# Patient Record
Sex: Female | Born: 1945 | Race: Black or African American | Hispanic: No | Marital: Married | Smoking: Current every day smoker
Health system: Southern US, Community
[De-identification: ages and names within clinical notes are randomized; demographics above are authoritative.]

## PROBLEM LIST (undated history)

## (undated) DIAGNOSIS — K219 Gastro-esophageal reflux disease without esophagitis: Secondary | ICD-10-CM

## (undated) DIAGNOSIS — K5909 Other constipation: Secondary | ICD-10-CM

## (undated) DIAGNOSIS — R29898 Other symptoms and signs involving the musculoskeletal system: Secondary | ICD-10-CM

## (undated) DIAGNOSIS — I10 Essential (primary) hypertension: Secondary | ICD-10-CM

## (undated) DIAGNOSIS — I639 Cerebral infarction, unspecified: Secondary | ICD-10-CM

## (undated) HISTORY — DX: Essential (primary) hypertension: I10

## (undated) HISTORY — PX: CYSTECTOMY: SUR359

## (undated) HISTORY — DX: Other constipation: K59.09

## (undated) HISTORY — DX: Other symptoms and signs involving the musculoskeletal system: R29.898

## (undated) HISTORY — DX: Gastro-esophageal reflux disease without esophagitis: K21.9

## (undated) HISTORY — DX: Cerebral infarction, unspecified: I63.9

## (undated) HISTORY — PX: TOTAL ABDOMINAL HYSTERECTOMY: SHX209

---

## 2001-06-22 ENCOUNTER — Encounter: Payer: Self-pay | Admitting: Family Medicine

## 2001-06-22 ENCOUNTER — Ambulatory Visit (HOSPITAL_COMMUNITY): Admission: RE | Admit: 2001-06-22 | Discharge: 2001-06-22 | Payer: Self-pay | Admitting: Family Medicine

## 2003-10-13 ENCOUNTER — Emergency Department (HOSPITAL_COMMUNITY): Admission: EM | Admit: 2003-10-13 | Discharge: 2003-10-13 | Payer: Self-pay | Admitting: Emergency Medicine

## 2006-05-02 ENCOUNTER — Emergency Department (HOSPITAL_COMMUNITY): Admission: EM | Admit: 2006-05-02 | Discharge: 2006-05-02 | Payer: Self-pay | Admitting: Emergency Medicine

## 2007-04-23 ENCOUNTER — Emergency Department (HOSPITAL_COMMUNITY): Admission: EM | Admit: 2007-04-23 | Discharge: 2007-04-24 | Payer: Self-pay | Admitting: Emergency Medicine

## 2009-11-20 ENCOUNTER — Ambulatory Visit (HOSPITAL_COMMUNITY): Admission: RE | Admit: 2009-11-20 | Discharge: 2009-11-20 | Payer: Self-pay | Admitting: Family Medicine

## 2010-09-25 DIAGNOSIS — I639 Cerebral infarction, unspecified: Secondary | ICD-10-CM

## 2010-09-25 HISTORY — DX: Cerebral infarction, unspecified: I63.9

## 2010-09-30 ENCOUNTER — Emergency Department (HOSPITAL_COMMUNITY): Payer: Medicaid Other

## 2010-09-30 ENCOUNTER — Inpatient Hospital Stay (HOSPITAL_COMMUNITY)
Admission: EM | Admit: 2010-09-30 | Discharge: 2010-10-02 | DRG: 066 | Disposition: A | Discharge status: Home Health | Payer: Medicaid Other | Attending: Family Medicine | Admitting: Family Medicine

## 2010-09-30 DIAGNOSIS — I10 Essential (primary) hypertension: Secondary | ICD-10-CM | POA: Diagnosis present

## 2010-09-30 DIAGNOSIS — E876 Hypokalemia: Secondary | ICD-10-CM | POA: Diagnosis present

## 2010-09-30 DIAGNOSIS — I6350 Cerebral infarction due to unspecified occlusion or stenosis of unspecified cerebral artery: Principal | ICD-10-CM | POA: Diagnosis present

## 2010-09-30 DIAGNOSIS — R279 Unspecified lack of coordination: Secondary | ICD-10-CM | POA: Diagnosis present

## 2010-09-30 DIAGNOSIS — E669 Obesity, unspecified: Secondary | ICD-10-CM | POA: Diagnosis present

## 2010-09-30 DIAGNOSIS — Z87891 Personal history of nicotine dependence: Secondary | ICD-10-CM

## 2010-09-30 DIAGNOSIS — R7309 Other abnormal glucose: Secondary | ICD-10-CM | POA: Diagnosis present

## 2010-09-30 DIAGNOSIS — Z833 Family history of diabetes mellitus: Secondary | ICD-10-CM

## 2010-09-30 DIAGNOSIS — R4789 Other speech disturbances: Secondary | ICD-10-CM | POA: Diagnosis present

## 2010-09-30 LAB — URINALYSIS, ROUTINE W REFLEX MICROSCOPIC
Bilirubin Urine: NEGATIVE
Glucose, UA: NEGATIVE mg/dL
Ketones, ur: NEGATIVE mg/dL
Leukocytes, UA: NEGATIVE
Nitrite: NEGATIVE
Specific Gravity, Urine: 1.01 (ref 1.005–1.030)
pH: 5.5 (ref 5.0–8.0)

## 2010-09-30 LAB — ETHANOL: Alcohol, Ethyl (B): <5 mg/dL (ref 0–10)

## 2010-09-30 LAB — DIFFERENTIAL
Eosinophils Absolute: 0.1 10*3/uL (ref 0.0–0.7)
Lymphs Abs: 2.6 10*3/uL (ref 0.7–4.0)
Monocytes Relative: 6 % (ref 3–12)
Neutro Abs: 3.7 10*3/uL (ref 1.7–7.7)
Neutrophils Relative %: 54 % (ref 43–77)

## 2010-09-30 LAB — CBC
Hemoglobin: 13.6 g/dL (ref 12.0–15.0)
MCH: 30.2 pg (ref 26.0–34.0)
MCV: 90.7 fL (ref 78.0–100.0)
Platelets: 295 10*3/uL (ref 150–400)
RBC: 4.5 MIL/uL (ref 3.87–5.11)
WBC: 6.8 10*3/uL (ref 4.0–10.5)

## 2010-09-30 LAB — BASIC METABOLIC PANEL
Chloride: 98 mEq/L (ref 96–112)
GFR calc Af Amer: >60 mL/min (ref >60)
GFR calc non Af Amer: >60 mL/min (ref >60)
Potassium: 3.4 mEq/L — ABNORMAL LOW (ref 3.5–5.1)
Sodium: 138 mEq/L (ref 135–145)

## 2010-09-30 LAB — URINE MICROSCOPIC-ADD ON

## 2010-09-30 LAB — AMMONIA: Ammonia: 31 umol/L (ref 11–35)

## 2010-10-01 ENCOUNTER — Inpatient Hospital Stay (HOSPITAL_COMMUNITY): Payer: Medicaid Other

## 2010-10-01 DIAGNOSIS — I6789 Other cerebrovascular disease: Secondary | ICD-10-CM

## 2010-10-09 NOTE — Progress Notes (Signed)
  [name], [name]              ACCOUNT NO.:  [id number]  MEDICAL RECORD NO.:  [id number]           PATIENT TYPE:  I  LOCATION:  A311                          FACILITY:  [facility]  PHYSICIAN:  Mila Homer. Sudie Bailey, M.D.DATE OF BIRTH:  Oct 10, 1945  DATE OF PROCEDURE:  09/30/2010 DATE OF DISCHARGE:                                PROGRESS NOTE   SUBJECTIVE:  She feels somewhat better today.  She has gone through a workup for stroke today.  OBJECTIVE:  GENERAL:  She is sitting up in bed eating dinner.  She is in no acute distress.  She is well-developed, well-nourished. VITAL SIGNS:  Her temperature is 98.1, pulse 55, respiratory rate 20, blood pressure 127/69. NEURO:  Her speech still appears to be a little bit slurred. CARDIOVASCULAR:  Her heart has a regular rhythm without murmur. RESPIRATORY:  Lungs are clear throughout. ABDOMEN:  Soft.  O2 sats 97% room air.  The carotid Doppler showed minimal plaque in the right ICA and no plaque in the left ICA, and the MRA of the brain was normal.  ASSESSMENT:  Lacunar infarct.  PLAN:  We discussed this at length.  She will be on aspirin 81 mg daily and we will make sure she has good blood pressure and lipid control.  I reviewed the notes from Dr. Gerilyn Pilgrim, the neurologist.  Hopefully, we will be able to discharge her home tomorrow.  Discussed this with the patient and her husband.     Mila Homer. Sudie Bailey, M.D.     SDK/MEDQ  D:  10/01/2010  T:  10/01/2010  Job:  [id number]  Electronically Signed by John Giovanni M.D. on 10/09/2010 05:16:22 AM

## 2010-10-09 NOTE — H&P (Signed)
NAMEDENISHIA, CITRO              ACCOUNT NO.:  [id number]  MEDICAL RECORD NO.:  [id number]           PATIENT TYPE:  I  LOCATION:  A311                          FACILITY:  [facility]  PHYSICIAN:  Mila Homer. Sudie Bailey, M.D.DATE OF BIRTH:  1946-01-26  DATE OF ADMISSION:  09/30/2010 DATE OF DISCHARGE:  LH                             HISTORY & PHYSICAL   This 65 year old woman developed sudden onset of dizziness starting about 4:00 a.m. the morning of admission, which was September 30, 2010.  She also noted numbness and tingling in the left arm, although this is something she had had before in the prior month.  The symptoms persisted.  So when she got up finally at 7 or 8, she decided to come up to the office for evaluation.  Medical history includes a history of tobacco use disorder, obesity, lumbar spondylosis, and a family history of diabetes.  She was seen in the office about a week prior to admission because of left upperquadrant abdominal pain that she had been having intermittently for 2 years.  At that time her blood pressure was 150/100; however, her blood pressure in October 2010 was 120/86 and her blood pressure in April 2011 was 120/72.  She works at [employer].  She is on no current medications except for Cipro 500 mg b.i.d.  She had no problems swallowing.  I asked her about her speech and apparently she had had some slurred speech at home which her husband felt would clear on its own.  Exam in the office showed a 31ish-year-old woman who had problems walking.  When she walked in the hall, she stumbled and had to hold onto the side walls in order not to fall.  She was a ataxic.  Her BP was 154/90.  The pulse 80.  Her speech was somewhat slurred but her sentence structure was intact.  She appeared to be oriented and alert.  Her heart had a regular rhythm without murmur, rate of about 80.  Her lungs appeared to be clear throughout.  Her swallowing appeared to be  normal.  A presumptive diagnosis in the office was a cerebrovascular accident.  I immediately called the [facility] emergency room and arranged for her transfer there for further workup.  Her admission white cell count was 6800 with a normal diff.  Her hemoglobin was 13.6.  BMP showed a potassium 3.4, glucose 111.  Alcohol level was less than 5 and an ammonia level 31.  Admission UA stick was negative and scope showed 0-2 WBCs and 0-2 RBCs per HPF.  CT of the head without contrast showed no acute intracranial abnormality.  She had mild chronic microvascular ischemic changes in the white matter.  ADMISSION DIAGNOSES: 1. Presumptive stroke. 2. History of tobacco use. 3. Family history for diabetes. 4. Hyperglycemia. 5. Obesity.  She is admitted to the hospital with IV fluids and put on aspirin daily. I have asked Dr. Gerilyn Pilgrim, neurologist, to see her in consult.  Pending will be an MRI of the brain and carotid Dopplers as well as an MRA of the brain.  Mila Homer. Sudie Bailey, M.D.     SDK/MEDQ  D:  10/02/2010  T:  10/02/2010  Job:  [id number]  Electronically Signed by John Giovanni M.D. on 10/09/2010 05:16:10 AM

## 2010-10-09 NOTE — Discharge Summary (Signed)
  [name], [name]              ACCOUNT NO.:  [id number]  MEDICAL RECORD NO.:  [id number]           PATIENT TYPE:  I  LOCATION:  A311                          FACILITY:  [facility]  PHYSICIAN:  Mila Homer. Sudie Bailey, M.D.DATE OF BIRTH:  04-16-1946  DATE OF ADMISSION:  09/30/2010 DATE OF DISCHARGE:  LH                              DISCHARGE SUMMARY   HISTORY OF PRESENT ILLNESS:  This 65 year old was admitted to the hospital September 30, 2010 with what turned out to be a lacunar infarct. She had a benign 3-day hospitalization extending until October 02, 2010. Vital signs remained stable.  She had a normal admission CBC and UA.  Her alcohol level was less than 5.  Ammonia level was 31.  BMP showed a glucose minimally elevated 111, potassium 3.4.  CT of the head without contrast showed mild chronic microvascular ischemic changes in the white matter.  MRI of the brain showed a left paracentral pontine acute infarct with acute small vessel ischemia. Cerebral subcortical white matter changes.  MRA of the brain was normal and the carotid ultrasound was essentially normal as well, except for very minimal plaque in the proximal right ICA.  She was admitted to the hospital.  She was put on aspirin 81 mg daily and had a speech evaluation.  I consulted Dr. Gerilyn Pilgrim , neurologist.  She was doing well on the 3rd day able to swallow and her speech was improved with much less slurring.  She is still had issues with walking since she was ataxic when she came in but she had detailed instructions on using a walker or cane or help from family members.  The family is in the room with her and they do have a walker and canes and understand they need help with this until, hopefully, this clears.  I reviewed her blood pressures while she was in the hospital and her diastolic pressures were very good, systolic pressures ran between 127 and 152.  FINAL DISCHARGE DIAGNOSES: 1. Lacunar infarct. 2. Mild benign  essential hypertension. 3. History of tobacco use. 4. Family history for diabetes. 5. Hyperglycemia. 6. Mild hypokalemia. 7. Obesity.  She is discharged home on aspirin 81 mg daily with follow-up with me in the office within 4-5 days.  Again I cautioned her on walking given her ataxia.     Mila Homer. Sudie Bailey, M.D.     SDK/MEDQ  D:  10/02/2010  T:  10/02/2010  Job:  [id number]  Electronically Signed by John Giovanni M.D. on 10/09/2010 05:15:54 AM

## 2010-11-03 NOTE — Consult Note (Signed)
[name], WIERSMA              ACCOUNT NO.:  [id number]  MEDICAL RECORD NO.:  [id number]           PATIENT TYPE:  I  LOCATION:  A311                          FACILITY:  [facility]  PHYSICIAN:  Rion Schnitzer A. Gerilyn Pilgrim, M.D. DATE OF BIRTH:  [birth year]  DATE OF CONSULTATION:  10/01/2010 DATE OF DISCHARGE:                                CONSULTATION   REASON FOR CONSULTATION:  Ataxia.  HISTORY:  This is a 66 year old black female who presented with the relatively acute onset of gait instability and ataxia.  The patient woke up yesterday with these symptoms.  She does endorse having some mild dysarthria and tingling of the left upper extremity.  She denies any focal weakness, however, there is no headaches, nausea, vomiting, dysphasia, or diplopia.  She denies any spinning sensation or dizziness. She reports that her symptoms went out for what appears to be several hours and it has improved, although she is still not back to normal.  No previous history of these type of spells before.  PAST MEDICAL HISTORY:  She had been relatively healthy.  Apparently, there is a diagnosis of dyslipidemia and hypertension, but has not been on any medications.  PAST SURGICAL HISTORY:  Hysterectomy and orthopedic procedure.  SOCIAL HISTORY:  She is married.  No alcohol, tobacco, or illicit drug use.  ALLERGIES:  None known.  REVIEW OF SYSTEMS:  As stated in the HPI, otherwise negative.  PHYSICAL EXAMINATION:  GENERAL:  A moderately overweight, very pleasant lady in no acute distress. VITAL SIGNS:  Temperature 97.5, pulse 54, respirations 18, blood pressure 133/84. HEENT:  Head is normocephalic, atraumatic. NECK:  Supple. ABDOMEN:  Soft. EXTREMITIES:  Mild/trace pitting edema at ankles. MENTATION:  She is awake and alert.  Speech, language and cognition are mostly unremarkable.  She may have some slight dysarthria.  Otherwise, she is lucid and coherent.  Cranial Nerves:  Pupils equal, round, reactive  to light and accommodation.  They are measured at approximately 4 mL.  Visual fields are intact.  Extraocular movements are full.  I see no nystagmus.  Facial muscle strength is symmetric and normal.  Tongue is midline.  Uvula midline.  Shoulder shrug is normal.  Motor examination shows normal bulk and tone throughout.  There is mild proximal weakness in the upper extremities 4+, otherwise strength is normal throughout in both upper and lower extremities.  Reflexes at 2+. Plantars are both downgoing.  Sensation normal to light touch and temperature.  Coordination shows no dysmetria.  There is no tremors or past-pointing.  SUPPORTIVE DATA:  Head CT scan of the brain shows nothing acute.  MRI of the brain is reviewed in person, it shows a tiny hyperintense lesion on diffusion imaging, paracentral lesion on the left side of pons.  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  No bleed is seen.  LABORATORY EVALUATION:  Ammonia 31 and ethanol is negative.  WBC 6.8, hemoglobin 13.6, platelet count 295.  Sodium 138, potassium 3.4,chloride 111, CO2 30, BUN 9, creatinine 0.8, glucose 111.  ASSESSMENT:  Acute lacunar infarct, left pontine region.  Risk factors, hypertension, dyslipidemia and age.  RECOMMENDATIONS:  Agree  with aspirin and continue with BP control.  Also wishes MRA to evaluate for vertebrobasilar disease.  This is most likely a small vessel issue, but I think it is important to evaluated the vertebrobasilar anatomy and a consequence could be catastrophic if she does have significant disease there.  Other recommendations are carotid Doppler and echocardiography.  Thanks for this consultation.     Deonne Rooks A. Gerilyn Pilgrim, M.D.     KAD/MEDQ  D:  10/01/2010  T:  10/01/2010  Job:  [id number]  Electronically Signed by Beryle Beams M.D. on 11/03/2010 09:04:45 AM

## 2010-11-03 NOTE — Progress Notes (Signed)
  NAMEMAEVE, DEBORD              ACCOUNT NO.:  [id number]  MEDICAL RECORD NO.:  [id number]           PATIENT TYPE:  I  LOCATION:  A311                          FACILITY:  [facility]  PHYSICIAN:  Austine Wiedeman A. Gerilyn Pilgrim, M.D. DATE OF BIRTH:  Nov 16, 1945  DATE OF PROCEDURE:  10/02/2010 DATE OF DISCHARGE:                                PROGRESS NOTE   HISTORY OF PRESENT ILLNESS:  No new complaints are reported today.  PHYSICAL EXAMINATION:  VITAL SIGNS:  Temperature 98.0, pulse 60, respirations 20, and blood pressure 140/75. HEENT:  Neck is supple.  Head is normocephalic and atraumatic. ABDOMEN:  Soft. EXTREMITIES:  No significant edema. NEUROLOGIC:  The patient is awake and alert.  She seems to be talking in clear sentences and not much dysarthria is noted today.  She is lucid and coherent.  Pupils are reactive.  Extraocular movements are full. Visual fields are intact.  Facial muscle strength is symmetric.  She has good tone, bulk, and strength in the upper extremities and legs.  No pronator drift.  There is no dysmetria seen on coordination testing.  DIAGNOSTIC DATA:  Echocardiography shows ejection fraction of 55% with no thrombus seen.  MRA shows no intracranial occlusive disease.  The carotid duplex Doppler is unremarkable.  ASSESSMENT AND PLAN:  Small vessel ischemic stroke, left pontine region. Continue with current care.  I am going to suggest physical therapy to evaluate for gait training.  The patient should be ready for discharge soon.  We will sign off, reconsult as needed.     Bindi Klomp A. Gerilyn Pilgrim, M.D.     KAD/MEDQ  D:  10/02/2010  T:  10/02/2010  Job:  [id number]  Electronically Signed by Beryle Beams M.D. on 11/03/2010 09:05:49 AM

## 2010-11-17 ENCOUNTER — Ambulatory Visit (HOSPITAL_COMMUNITY)
Admission: RE | Admit: 2010-11-17 | Discharge: 2010-11-17 | Disposition: A | Discharge status: Home / Self Care | Payer: Self-pay | Source: Ambulatory Visit | Attending: Speech Pathology | Admitting: Speech Pathology

## 2010-11-17 DIAGNOSIS — R482 Apraxia: Secondary | ICD-10-CM | POA: Insufficient documentation

## 2010-11-17 DIAGNOSIS — IMO0001 Reserved for inherently not codable concepts without codable children: Secondary | ICD-10-CM | POA: Insufficient documentation

## 2011-02-16 ENCOUNTER — Other Ambulatory Visit (HOSPITAL_COMMUNITY): Payer: Self-pay | Admitting: Family Medicine

## 2011-02-16 DIAGNOSIS — I639 Cerebral infarction, unspecified: Secondary | ICD-10-CM

## 2011-02-19 ENCOUNTER — Ambulatory Visit (HOSPITAL_COMMUNITY)
Admission: RE | Admit: 2011-02-19 | Discharge: 2011-02-19 | Disposition: A | Discharge status: Home / Self Care | Payer: Medicare Other | Source: Ambulatory Visit | Attending: Family Medicine | Admitting: Family Medicine

## 2011-02-19 DIAGNOSIS — I639 Cerebral infarction, unspecified: Secondary | ICD-10-CM

## 2011-02-20 ENCOUNTER — Other Ambulatory Visit (HOSPITAL_COMMUNITY): Payer: Self-pay | Admitting: Family Medicine

## 2011-02-20 DIAGNOSIS — R1310 Dysphagia, unspecified: Secondary | ICD-10-CM

## 2011-02-20 DIAGNOSIS — I639 Cerebral infarction, unspecified: Secondary | ICD-10-CM

## 2011-02-24 ENCOUNTER — Ambulatory Visit (HOSPITAL_COMMUNITY)
Admission: RE | Admit: 2011-02-24 | Discharge: 2011-02-24 | Disposition: A | Discharge status: Home / Self Care | Payer: Medicare Other | Source: Ambulatory Visit | Attending: Family Medicine | Admitting: Family Medicine

## 2011-02-24 DIAGNOSIS — R1310 Dysphagia, unspecified: Secondary | ICD-10-CM | POA: Insufficient documentation

## 2011-02-24 DIAGNOSIS — K224 Dyskinesia of esophagus: Secondary | ICD-10-CM | POA: Insufficient documentation

## 2011-02-24 DIAGNOSIS — I639 Cerebral infarction, unspecified: Secondary | ICD-10-CM

## 2011-02-24 DIAGNOSIS — K449 Diaphragmatic hernia without obstruction or gangrene: Secondary | ICD-10-CM | POA: Insufficient documentation

## 2011-02-24 DIAGNOSIS — K222 Esophageal obstruction: Secondary | ICD-10-CM | POA: Insufficient documentation

## 2011-03-23 ENCOUNTER — Encounter (INDEPENDENT_AMBULATORY_CARE_PROVIDER_SITE_OTHER): Payer: Self-pay

## 2011-03-23 ENCOUNTER — Encounter (INDEPENDENT_AMBULATORY_CARE_PROVIDER_SITE_OTHER): Payer: Self-pay | Admitting: *Deleted

## 2011-04-20 ENCOUNTER — Encounter (INDEPENDENT_AMBULATORY_CARE_PROVIDER_SITE_OTHER): Payer: Self-pay | Admitting: Internal Medicine

## 2011-04-20 ENCOUNTER — Telehealth (INDEPENDENT_AMBULATORY_CARE_PROVIDER_SITE_OTHER): Payer: Self-pay | Admitting: *Deleted

## 2011-04-20 ENCOUNTER — Ambulatory Visit (INDEPENDENT_AMBULATORY_CARE_PROVIDER_SITE_OTHER): Payer: Medicare Other | Admitting: Internal Medicine

## 2011-04-20 VITALS — BP 150/82 | HR 76 | Temp 98.2°F | Ht 63.0 in | Wt 193.9 lb

## 2011-04-20 DIAGNOSIS — R1314 Dysphagia, pharyngoesophageal phase: Secondary | ICD-10-CM

## 2011-04-20 DIAGNOSIS — R1310 Dysphagia, unspecified: Secondary | ICD-10-CM

## 2011-04-20 NOTE — Progress Notes (Signed)
Subjective:     Patient ID: [name], female   DOB: 1945-11-16, 65 y.o.   MRN: [id number]  HPI  [name] is a 65 yr old female referred to our office for dysphagia.  She was seen by Dr. Sudie Bailey and underwent a Barium Swallow 02/24/2011. Revealed a small sliding hiatal hernia with a small non obstructing Schatzki .  Mild diffuse age related impairment of esophageal motility.  She has had symptoms since suffering a CVA in March of this year.She tell me that sometimes when she swallows it feel like it doesn't go down. She will drink water and the bolus will go down.   No problem swallowing liquids. This occurs about 1-2 times a month. She occasionally has acid reflux.  Her appetite is good. No weight loss. No abdominal pain. She usually has a BM about 1-3 times a day. No melena or bright red rectal bleeding. Review of Systems see hpi     Current Outpatient Prescriptions  Medication Sig Dispense Refill  . aspirin 81 MG tablet Take 81 mg by mouth daily.        . furosemide (LASIX) 40 MG tablet Take 40 mg by mouth 2 (two) times daily.        Marland Kitchen lisinopril (PRINIVIL,ZESTRIL) 10 MG tablet Take 10 mg by mouth daily.         Past Surgical History  Procedure Date  . Stroke 09/2010  . Total abdominal hysterectomy    Past Medical History  Diagnosis Date  . GERD (gastroesophageal reflux disease)   . Dysphagia   . Weakness of left leg     and left arm from a CVA  . Hypertension   . CVA (cerebral infarction)   . CVA (cerebral infarction)     in March of this year which she describes as slight.  . Chronic constipation    History   Social History Narrative  . No narrative on file   History reviewed. No pertinent family history. Family Status  Relation Status Death Age  . Mother Deceased     enlarged heart  . Father Deceased     old age in his 2s  . Sister Deceased      She says they were in there 25s. She did not know the cause of death.  . Brother Deceased     Two died from cancer?Marland Kitchen   She thinks the rest died from old age.  . Child Alive     Objective:   Physical Exam Alert and oriented. Skin warm and dry. Oral mucosa is moist. Natural teeth in good condition. Sclera anicteric, conjunctivae is pink. Thyroid not enlarged. No cervical lymphadenopathy. Lungs clear. Heart regular rate and rhythm.  Abdomen is soft. Bowel sounds are positive. No hepatomegaly. No abdominal masses felt. No tenderness.  2+ edema to lower extremities. Patient is alert and oriented.     Assessment:    Dysphagia.   Pill study reveal a small sliding hiatal with a small non obstructing Schatzki. Mild diffuse age-related impairment of esophageal motility.  She is not having any problems swallowing liquids.     Plan:     I discussed this case with Dr Karilyn Cota. Will schedule an EGD/ED. All questions were answered.  Patient states she is satisfied were here care today.  Samples of Prilosec OTC given to patient x3 months supply.

## 2011-04-20 NOTE — Telephone Encounter (Signed)
EGD/ED sch'd 05/07/11 @ 3:20 (2:20), asa 2 days

## 2011-04-21 ENCOUNTER — Encounter (INDEPENDENT_AMBULATORY_CARE_PROVIDER_SITE_OTHER): Payer: Self-pay | Admitting: Internal Medicine

## 2011-05-07 ENCOUNTER — Encounter (HOSPITAL_COMMUNITY): Payer: Self-pay | Admitting: *Deleted

## 2011-05-07 ENCOUNTER — Encounter (HOSPITAL_COMMUNITY)
Admission: RE | Disposition: A | Discharge status: Home / Self Care | Payer: Self-pay | Source: Ambulatory Visit | Attending: Internal Medicine

## 2011-05-07 ENCOUNTER — Ambulatory Visit (HOSPITAL_COMMUNITY)
Admission: RE | Admit: 2011-05-07 | Discharge: 2011-05-07 | Disposition: A | Discharge status: Home / Self Care | Payer: Medicare Other | Source: Ambulatory Visit | Attending: Internal Medicine | Admitting: Internal Medicine

## 2011-05-07 DIAGNOSIS — Z538 Procedure and treatment not carried out for other reasons: Secondary | ICD-10-CM | POA: Insufficient documentation

## 2011-05-07 DIAGNOSIS — R1310 Dysphagia, unspecified: Secondary | ICD-10-CM | POA: Insufficient documentation

## 2011-05-07 SURGERY — ESOPHAGOGASTRODUODENOSCOPY (EGD) WITH ESOPHAGEAL DILATION
Anesthesia: Moderate Sedation

## 2011-05-07 MED ORDER — MIDAZOLAM HCL 5 MG/5ML IJ SOLN
INTRAMUSCULAR | Status: AC
  Filled 2011-05-07: qty 10

## 2011-05-07 MED ORDER — SODIUM CHLORIDE 0.45 % IV SOLN
Freq: Once | INTRAVENOUS | Status: AC
  Administered 2011-05-07: 20 mL/h via INTRAVENOUS

## 2011-05-07 MED ORDER — MEPERIDINE HCL 50 MG/ML IJ SOLN
INTRAMUSCULAR | Status: AC
  Filled 2011-05-07: qty 1

## 2011-05-07 NOTE — H&P (Signed)
This is an update to history and physical. Patient was recently seen in the office for dysphagia. Patient relates completely different history. She states she has difficulty only with liquids going past the throat. She has no difficulty with solids; she she states her heartburn is better with PPI.  patient has already undergone barium pill study and she hasn't noncritical chart is ring. She may also have mild esophageal dysmotility. U in patient's symptoms I do not feel EGD and dilation is warranted. If patient starts to experience dysphagia to solids we would be glad to bring her back for this procedure

## 2012-10-26 ENCOUNTER — Other Ambulatory Visit (HOSPITAL_COMMUNITY): Payer: Self-pay | Admitting: Family Medicine

## 2012-10-26 DIAGNOSIS — Z139 Encounter for screening, unspecified: Secondary | ICD-10-CM

## 2012-10-27 ENCOUNTER — Ambulatory Visit (HOSPITAL_COMMUNITY): Payer: Medicare Other

## 2012-11-10 NOTE — Progress Notes (Signed)
Received a referral from Dr. Sudie Bailey for pt to have screening colonoscopy. I had printed the letter to send but then realized that this pt has been seen at Dr. Patty Sermons. So I faxed referral back to Dr. Sudie Bailey and did not send the letter.

## 2012-11-15 ENCOUNTER — Encounter (INDEPENDENT_AMBULATORY_CARE_PROVIDER_SITE_OTHER): Payer: Self-pay | Admitting: *Deleted

## 2013-01-10 ENCOUNTER — Other Ambulatory Visit (INDEPENDENT_AMBULATORY_CARE_PROVIDER_SITE_OTHER): Payer: Self-pay | Admitting: *Deleted

## 2013-01-10 ENCOUNTER — Telehealth (INDEPENDENT_AMBULATORY_CARE_PROVIDER_SITE_OTHER): Payer: Self-pay | Admitting: *Deleted

## 2013-01-10 ENCOUNTER — Encounter (INDEPENDENT_AMBULATORY_CARE_PROVIDER_SITE_OTHER): Payer: Self-pay | Admitting: *Deleted

## 2013-01-10 DIAGNOSIS — Z1211 Encounter for screening for malignant neoplasm of colon: Secondary | ICD-10-CM

## 2013-01-10 MED ORDER — PEG-KCL-NACL-NASULF-NA ASC-C 100 G PO SOLR: 1.0000 | Freq: Once | ORAL | Status: DC

## 2013-01-10 NOTE — Telephone Encounter (Signed)
Patient needs movi prep 

## 2013-02-07 ENCOUNTER — Telehealth (INDEPENDENT_AMBULATORY_CARE_PROVIDER_SITE_OTHER): Payer: Self-pay | Admitting: *Deleted

## 2013-02-07 NOTE — Telephone Encounter (Signed)
  Procedure: tcs  Reason/Indication:  screening  Has patient had this procedure before?  no  If so, when, by whom and where?    Is there a family history of colon cancer?  no  Who?  What age when diagnosed?    Is patient diabetic?   no      Does patient have prosthetic heart valve?  no  Do you have a pacemaker?  no  Has patient ever had endocarditis? no  Has patient had joint replacement within last 12 months?  no  Is patient on Coumadin, Plavix and/or Aspirin? yes  Medications: asa 81 mg daily, lisinopril 10 mg daily, hctz 12.5 mg daily, lorazepam 0.5 mg prn  Allergies: nkda  Medication Adjustment: asa 2 days  Procedure date & time: 03/01/13 at 815

## 2013-02-07 NOTE — Telephone Encounter (Signed)
agree

## 2013-02-22 ENCOUNTER — Encounter (HOSPITAL_COMMUNITY): Payer: Self-pay | Admitting: Pharmacy Technician

## 2013-03-01 ENCOUNTER — Encounter (HOSPITAL_COMMUNITY): Payer: Self-pay | Admitting: *Deleted

## 2013-03-01 ENCOUNTER — Ambulatory Visit (HOSPITAL_COMMUNITY)
Admission: RE | Admit: 2013-03-01 | Discharge: 2013-03-01 | Disposition: A | Discharge status: Home / Self Care | Payer: Medicare Other | Source: Ambulatory Visit | Attending: Internal Medicine | Admitting: Internal Medicine

## 2013-03-01 ENCOUNTER — Encounter (HOSPITAL_COMMUNITY)
Admission: RE | Disposition: A | Discharge status: Home / Self Care | Payer: Self-pay | Source: Ambulatory Visit | Attending: Internal Medicine

## 2013-03-01 DIAGNOSIS — K5730 Diverticulosis of large intestine without perforation or abscess without bleeding: Secondary | ICD-10-CM | POA: Insufficient documentation

## 2013-03-01 DIAGNOSIS — K644 Residual hemorrhoidal skin tags: Secondary | ICD-10-CM | POA: Insufficient documentation

## 2013-03-01 DIAGNOSIS — D128 Benign neoplasm of rectum: Secondary | ICD-10-CM | POA: Insufficient documentation

## 2013-03-01 DIAGNOSIS — D126 Benign neoplasm of colon, unspecified: Secondary | ICD-10-CM | POA: Insufficient documentation

## 2013-03-01 DIAGNOSIS — Z1211 Encounter for screening for malignant neoplasm of colon: Secondary | ICD-10-CM | POA: Insufficient documentation

## 2013-03-01 DIAGNOSIS — I10 Essential (primary) hypertension: Secondary | ICD-10-CM | POA: Insufficient documentation

## 2013-03-01 HISTORY — DX: Cerebral infarction, unspecified: I63.9

## 2013-03-01 HISTORY — PX: COLONOSCOPY: SHX5424

## 2013-03-01 SURGERY — COLONOSCOPY
Anesthesia: Moderate Sedation

## 2013-03-01 MED ORDER — STERILE WATER FOR IRRIGATION IR SOLN
Status: DC | PRN
  Administered 2013-03-01: 08:00:00

## 2013-03-01 MED ORDER — MEPERIDINE HCL 50 MG/ML IJ SOLN
INTRAMUSCULAR | Status: DC | PRN
  Administered 2013-03-01 (×2): 25 mg via INTRAVENOUS

## 2013-03-01 MED ORDER — MIDAZOLAM HCL 5 MG/5ML IJ SOLN
INTRAMUSCULAR | Status: AC
  Filled 2013-03-01: qty 10

## 2013-03-01 MED ORDER — SODIUM CHLORIDE 0.9 % IV SOLN
INTRAVENOUS | Status: DC
  Administered 2013-03-01: 08:00:00 via INTRAVENOUS

## 2013-03-01 MED ORDER — MEPERIDINE HCL 50 MG/ML IJ SOLN
INTRAMUSCULAR | Status: AC
  Filled 2013-03-01: qty 1

## 2013-03-01 MED ORDER — MIDAZOLAM HCL 5 MG/5ML IJ SOLN
INTRAMUSCULAR | Status: DC | PRN
  Administered 2013-03-01 (×2): 2 mg via INTRAVENOUS
  Administered 2013-03-01 (×2): 1 mg via INTRAVENOUS

## 2013-03-01 NOTE — H&P (Signed)
[name] is an 67 y.o. female.   Chief Complaint: Patient is here for colonoscopy. HPI: Patient is 67 year old African female who is here for screening colonoscopy. This is patient's first exam. She denies abdominal pain change in her bowel habits rectal bleeding. Family history is negative for colorectal carcinoma for this or facet colonic polyps.  Past Medical History  Diagnosis Date  . GERD (gastroesophageal reflux disease)   . Dysphagia   . Weakness of left leg     and left arm from a CVA  . Hypertension   . CVA (cerebral infarction) 09/2010  . CVA (cerebral infarction)     in March of this year which she describes as slight.  . Chronic constipation   . Stroke     Past Surgical History  Procedure Laterality Date  . Total abdominal hysterectomy    . Cystectomy      shoulder    Family History  Problem Relation Age of Onset  . Colon cancer Neg Hx   . Colon polyps Sister    Social History:  reports that she has been smoking Cigarettes.  She has a 5 pack-year smoking history. She does not have any smokeless tobacco history on file. She reports that she does not drink alcohol or use illicit drugs.  Allergies: No Known Allergies  Medications Prior to Admission  Medication Sig Dispense Refill  . aspirin EC 81 MG tablet Take 81 mg by mouth daily.      . furosemide (LASIX) 40 MG tablet Take 40 mg by mouth 2 (two) times daily.        Marland Kitchen lisinopril (PRINIVIL,ZESTRIL) 10 MG tablet Take 10 mg by mouth daily.        . peg 3350 powder (MOVIPREP) 100 G SOLR Take 1 kit (100 g total) by mouth once.  1 kit  0    No results found for this or any previous visit (from the past 48 hour(s)). No results found.  ROS  Blood pressure 131/77, temperature 98.1 F (36.7 C), temperature source Oral, resp. rate 22, height 5\' 3"  (1.6 m), weight 170 lb (77.111 kg), SpO2 98.00%. Physical Exam  Constitutional: She appears well-developed and well-nourished.  HENT:  Mouth/Throat: Oropharynx is  clear and moist.  Eyes: Conjunctivae are normal. No scleral icterus.  Neck: No thyromegaly present.  Cardiovascular: Normal rate, regular rhythm and normal heart sounds.   No murmur heard. Respiratory: Effort normal and breath sounds normal.  GI: Soft. Bowel sounds are normal.  Lower midline scar  Musculoskeletal: She exhibits no edema.  Lymphadenopathy:    She has no cervical adenopathy.  Neurological: She is alert.  Skin: Skin is warm and dry.     Assessment/Plan Average risk screening colonoscopy.  [name] U 03/01/2013, 8:24 AM

## 2013-03-01 NOTE — Op Note (Signed)
COLONOSCOPY PROCEDURE REPORT  PATIENT:  [name]  MR#:  [id number] Birthdate:  04-Aug-1945, 67 y.o., female Endoscopist:  Dr. Malissa Hippo, MD Referred By:  Dr. Mila Homer. Sudie Bailey, MD Procedure Date: 03/01/2013  Procedure:   Colonoscopy  Indications: Patient is 67 year old African female who is undergoing average risk screening colonoscopy.  Informed Consent:  The procedure and risks were reviewed with the patient and informed consent was obtained.  Medications:  Demerol 50  mg IV  Versed 6 mg IV  Description of procedure:  After a digital rectal exam was performed, that colonoscope was advanced from the anus through the rectum and colon to the area of the cecum, ileocecal valve and appendiceal orifice. The cecum was deeply intubated. These structures were well-seen and photographed for the record. From the level of the cecum and ileocecal valve, the scope was slowly and cautiously withdrawn. The mucosal surfaces were carefully surveyed utilizing scope tip to flexion to facilitate fold flattening as needed. The scope was pulled down into the rectum where a thorough exam including retroflexion was performed.  Findings:   Prep excellent. Two small cecal polyps ablated via cold biopsy and submitted together. Three small polyps ablated via cold biopsy from rectosigmoid junction and submitted together. 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 Normal rectal mucosa. Small hemorrhoids below the dentate line.    Therapeutic/Diagnostic Maneuvers Performed:  See above  Complications:  None  Cecal Withdrawal Time:  17 minutes  Impression:  Examination performed to cecum. Two small cecal polyps ablated via cold biopsy and submitted together. Three small polyps ablated via cold biopsy from rectosigmoid junction and submitted together. 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 Small external hemorrhoids.  Recommendations:  Standard instructions given. I will contact patient with  biopsy results and further recommendations.  Estefana Taylor U  03/01/2013 9:09 AM  CC: Dr. Milana Obey, MD & Dr. Bonnetta Barry ref. provider found

## 2013-03-03 ENCOUNTER — Encounter (HOSPITAL_COMMUNITY): Payer: Self-pay | Admitting: Internal Medicine

## 2013-03-10 ENCOUNTER — Encounter (INDEPENDENT_AMBULATORY_CARE_PROVIDER_SITE_OTHER): Payer: Self-pay | Admitting: *Deleted

## 2013-12-04 ENCOUNTER — Emergency Department (HOSPITAL_COMMUNITY)
Admission: EM | Admit: 2013-12-04 | Discharge: 2013-12-04 | Disposition: A | Discharge status: Home / Self Care | Payer: Medicare HMO | Attending: Emergency Medicine | Admitting: Emergency Medicine

## 2013-12-04 ENCOUNTER — Encounter (HOSPITAL_COMMUNITY): Payer: Self-pay | Admitting: Emergency Medicine

## 2013-12-04 ENCOUNTER — Emergency Department (HOSPITAL_COMMUNITY): Payer: Medicare HMO

## 2013-12-04 DIAGNOSIS — R1310 Dysphagia, unspecified: Secondary | ICD-10-CM | POA: Insufficient documentation

## 2013-12-04 DIAGNOSIS — Z7982 Long term (current) use of aspirin: Secondary | ICD-10-CM | POA: Insufficient documentation

## 2013-12-04 DIAGNOSIS — R6889 Other general symptoms and signs: Secondary | ICD-10-CM | POA: Insufficient documentation

## 2013-12-04 DIAGNOSIS — I10 Essential (primary) hypertension: Secondary | ICD-10-CM | POA: Insufficient documentation

## 2013-12-04 DIAGNOSIS — F17200 Nicotine dependence, unspecified, uncomplicated: Secondary | ICD-10-CM | POA: Insufficient documentation

## 2013-12-04 DIAGNOSIS — Z8719 Personal history of other diseases of the digestive system: Secondary | ICD-10-CM | POA: Insufficient documentation

## 2013-12-04 DIAGNOSIS — Z8673 Personal history of transient ischemic attack (TIA), and cerebral infarction without residual deficits: Secondary | ICD-10-CM | POA: Insufficient documentation

## 2013-12-04 DIAGNOSIS — Z8739 Personal history of other diseases of the musculoskeletal system and connective tissue: Secondary | ICD-10-CM | POA: Insufficient documentation

## 2013-12-04 DIAGNOSIS — R0989 Other specified symptoms and signs involving the circulatory and respiratory systems: Secondary | ICD-10-CM

## 2013-12-04 LAB — CBC WITH DIFFERENTIAL/PLATELET
BASOS PCT: 1 % (ref 0–1)
Basophils Absolute: 0.1 10*3/uL (ref 0.0–0.1)
EOS ABS: 0.1 10*3/uL (ref 0.0–0.7)
Eosinophils Relative: 2 % (ref 0–5)
HCT: 36.2 % (ref 36.0–46.0)
Hemoglobin: 12.1 g/dL (ref 12.0–15.0)
Lymphocytes Relative: 39 % (ref 12–46)
Lymphs Abs: 2.5 10*3/uL (ref 0.7–4.0)
MCH: 30.6 pg (ref 26.0–34.0)
MCHC: 33.4 g/dL (ref 30.0–36.0)
MCV: 91.4 fL (ref 78.0–100.0)
Monocytes Absolute: 0.3 10*3/uL (ref 0.1–1.0)
Monocytes Relative: 5 % (ref 3–12)
NEUTROS PCT: 53 % (ref 43–77)
Neutro Abs: 3.5 10*3/uL (ref 1.7–7.7)
Platelets: 259 10*3/uL (ref 150–400)
RBC: 3.96 MIL/uL (ref 3.87–5.11)
RDW: 14.6 % (ref 11.5–15.5)
WBC: 6.5 10*3/uL (ref 4.0–10.5)

## 2013-12-04 LAB — BASIC METABOLIC PANEL
BUN: 18 mg/dL (ref 6–23)
CO2: 27 mEq/L (ref 19–32)
Calcium: 9.5 mg/dL (ref 8.4–10.5)
Chloride: 104 mEq/L (ref 96–112)
Creatinine, Ser: 0.98 mg/dL (ref 0.50–1.10)
GFR, EST AFRICAN AMERICAN: 68 mL/min — AB (ref >90)
GFR, EST NON AFRICAN AMERICAN: 58 mL/min — AB (ref >90)
Glucose, Bld: 110 mg/dL — ABNORMAL HIGH (ref 70–99)
Potassium: 3.6 mEq/L — ABNORMAL LOW (ref 3.7–5.3)
Sodium: 145 mEq/L (ref 137–147)

## 2013-12-04 LAB — PRO B NATRIURETIC PEPTIDE: PRO B NATRI PEPTIDE: 241.1 pg/mL — AB (ref 0–125)

## 2013-12-04 LAB — TROPONIN I: Troponin I: <0.3 ng/mL (ref <0.30)

## 2013-12-04 NOTE — ED Provider Notes (Signed)
CSN: [id number]     Arrival date & time 12/04/13  1948 History   First MD Initiated Contact with Patient 12/04/13 1954     Chief Complaint  Patient presents with  . Choking     (Consider location/radiation/quality/duration/timing/severity/associated sxs/prior Treatment) HPI Comments: Patient presented to the ER for evaluation after a choking spell. Patient reports that she wears at work when the episode occurred. She was in a hot kitchen, felt a little reviewed antibiotic last water. When she took a drink she choked on it. Afterwards she kept feeling like there is still something in her throat. She is not short of breath, however. EMS reports that her oxygen saturations have been normal throughout their transport. Patient does have a history of CVA. No new numbness, weakness or unilateral neurologic symptoms.   Past Medical History  Diagnosis Date  . GERD (gastroesophageal reflux disease)   . Dysphagia   . Weakness of left leg     and left arm from a CVA  . Hypertension   . CVA (cerebral infarction) 09/2010  . CVA (cerebral infarction)     in March of this year which she describes as slight.  . Chronic constipation   . Stroke    Past Surgical History  Procedure Laterality Date  . Total abdominal hysterectomy    . Cystectomy      shoulder  . Colonoscopy N/A 03/01/2013    Procedure: COLONOSCOPY;  Surgeon: Rogene Houston, MD;  Location: [facility];  Service: Endoscopy;  Laterality: N/A;  54   Family History  Problem Relation Age of Onset  . Colon cancer Neg Hx   . Colon polyps Sister    History  Substance Use Topics  . Smoking status: Current Every Day Smoker -- 0.25 packs/day for 20 years    Types: Cigarettes  . Smokeless tobacco: Not on file     Comment: 3 cigarettes a day  . Alcohol Use: No   OB History   Grav Para Term Preterm Abortions TAB SAB Ect Mult Living                 Review of Systems  Respiratory: Positive for choking.   All other systems reviewed  and are negative.     Allergies  Review of patient's allergies indicates no known allergies.  Home Medications   Prior to Admission medications   Medication Sig Start Date End Date Taking? Authorizing Provider  aspirin EC 81 MG tablet Take 81 mg by mouth every morning.    Yes Historical Provider, MD  Cholecalciferol (VITAMIN D PO) Take 1 capsule by mouth every morning.   Yes Historical Provider, MD  UNKNOWN TO PATIENT Take 1 tablet by mouth every morning. BP/FLUID COMBO   Yes Historical Provider, MD  UNKNOWN TO PATIENT Take 1 tablet by mouth every morning. FLUID MEDICATION   Yes Historical Provider, MD   BP 159/96  Pulse 105  Temp(Src) 98.5 F (36.9 C) (Oral)  Resp 18  Ht 5\' 3"  (1.6 m)  Wt 170 lb (77.111 kg)  BMI 30.12 kg/m2  SpO2 100% Physical Exam  Constitutional: She is oriented to person, place, and time. She appears well-developed and well-nourished. No distress.  HENT:  Head: Normocephalic and atraumatic.  Right Ear: Hearing normal.  Left Ear: Hearing normal.  Nose: Nose normal.  Mouth/Throat: Oropharynx is clear and moist and mucous membranes are normal.  Eyes: Conjunctivae and EOM are normal. Pupils are equal, round, and reactive to light.  Neck: Normal  range of motion. Neck supple.  Cardiovascular: Regular rhythm, S1 normal and S2 normal.  Exam reveals no gallop and no friction rub.   No murmur heard. Pulmonary/Chest: Effort normal and breath sounds normal. No respiratory distress. She exhibits no tenderness.  Abdominal: Soft. Normal appearance and bowel sounds are normal. There is no hepatosplenomegaly. There is no tenderness. There is no rebound, no guarding, no tenderness at McBurney's point and negative Murphy's sign. No hernia.  Musculoskeletal: Normal range of motion.  Neurological: She is alert and oriented to person, place, and time. She has normal strength. No cranial nerve deficit or sensory deficit. Coordination normal. GCS eye subscore is 4. GCS verbal  subscore is 5. GCS motor subscore is 6.  Skin: Skin is warm, dry and intact. No rash noted. No cyanosis.  Psychiatric: She has a normal mood and affect. Her speech is normal and behavior is normal. Thought content normal.    ED Course  Procedures (including critical care time) Labs Review Labs Reviewed  BASIC METABOLIC PANEL - Abnormal; Notable for the following:    Potassium 3.6 (*)    Glucose, Bld 110 (*)    GFR calc non Af Amer 58 (*)    GFR calc Af Amer 68 (*)    All other components within normal limits  PRO B NATRIURETIC PEPTIDE - Abnormal; Notable for the following:    Pro B Natriuretic peptide (BNP) 241.1 (*)    All other components within normal limits  CBC WITH DIFFERENTIAL  TROPONIN I    Imaging Review No results found.   EKG Interpretation None      MDM   Final diagnoses:  None    Patient presents to the ER for evaluation of choking while taking water. She is not hypoxic. Lungs are clear to auscultation. She has had a history of a stroke, but there are no new neurologic symptoms. She has a stated history of dysphagia as well as left hemiparesis from CVA. There might be some difficulty with swallowing based on her previous stroke. She does report that she has had several episodes of choking while drinking water. No immediate abnormalities on workup today. Chest x-ray clear. Doing well, no further coughing or choking, back to her baseline. Will refer back to primary care doctor to determine if she needs further speech therapy/swallow evaluation as an outpatient.    Orpah Greek, MD 12/04/13 2151

## 2013-12-04 NOTE — ED Notes (Signed)
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 She was drinking water and got choked. 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

## 2013-12-04 NOTE — ED Notes (Signed)
Pt's voice is no longer hoarse

## 2013-12-04 NOTE — Discharge Instructions (Signed)
Dysphagia Swallowing problems (dysphagia) occur when solids and liquids seem to stick in your throat on the way down to your stomach, or the food takes longer to get to the stomach. Other symptoms include regurgitating food, noises coming from the throat, chest discomfort with swallowing, and a feeling of fullness or the feeling of something being stuck in your throat when swallowing. When blockage in your throat is complete it may be associated with drooling. CAUSES  Problems with swallowing may occur because of problems with the muscles. The food cannot be propelled in the usual manner into your stomach. You may have ulcers, scar tissue, or inflammation in the tube down which food travels from your mouth to your stomach (esophagus), which blocks food from passing normally into the stomach. Causes of inflammation include:  Acid reflux from your stomach into your esophagus.  Infection.  Radiation treatment for cancer.  Medicines taken without enough fluids to wash them down into your stomach. You may have nerve problems that prevent signals from being sent to the muscles of your esophagus to contract and move your food down to your stomach. Globus pharyngeus is a relatively common problem in which there is a sense of an obstruction or difficulty in swallowing, without any physical abnormalities of the swallowing passages being found. This problem usually improves over time with reassurance and testing to rule out other causes. DIAGNOSIS Dysphagia can be diagnosed and its cause can be determined by tests in which you swallow a white substance that helps illuminate the inside of your throat (contrast medium) while X-rays are taken. Sometimes a flexible telescope that is inserted down your throat (endoscopy) to look at your esophagus and stomach is used. TREATMENT   If the dysphagia is caused by acid reflux or infection, medicines may be used.  If the dysphagia is caused by problems with your  swallowing muscles, swallowing therapy may be used to help you strengthen your swallowing muscles.  If the dysphagia is caused by a blockage or mass, procedures to remove the blockage may be done. HOME CARE INSTRUCTIONS  Try to eat soft food that is easier to swallow and check your weight on a daily basis to be sure that it is not decreasing.  Be sure to drink liquids when sitting upright (not lying down). SEEK MEDICAL CARE IF:  You are losing weight because you are unable to swallow.  You are coughing when you drink liquids (aspiration).  You are coughing up partially digested food. SEEK IMMEDIATE MEDICAL CARE IF:  You are unable to swallow your own saliva .  You are having shortness of breath or a fever, or both.  You have a hoarse voice along with difficulty swallowing. MAKE SURE YOU:  Understand these instructions.  Will watch your condition.  Will get help right away if you are not doing well or get worse. Document Released: 07/10/2000 Document Revised: 03/15/2013 Document Reviewed: 12/30/2012 [facility] Patient Information 2014 [place].  Choking, Adult Choking occurs when a food or object gets stuck in the throat or trachea, blocking the airway. If the airway is partly blocked, coughing will usually cause the food or object to come out. If the airway is completely blocked, immediate action is needed to help it come out. A complete airway blockage is life-threatening because it causes breathing to stop. Choking is a true medical emergency that requires fast, appropriate action by anyone available. SIGNS OF AIRWAY BLOCKAGE There is a partial airway blockage if you or the person who is  choking is:   Able to breathe and speak.  Coughing loudly.  Making loud noises. There is a complete airway blockage if you or the person who is choking is:   Unable to breathe.  Making soft or high-pitched sounds while breathing.  Unable to cough or coughing weakly,  ineffectively, or silently.  Unable to cry, speak, or make sounds.  Turning blue.  Holding the neck with both arms. This is the universal sign of choking. WHAT TO DO IF CHOKING OCCURS If there is a partial airway blockage, allow coughing to clear the airway. Do not try to drink until the food or object comes out. If someone else has a partial airway blockage, do not interfere. Stay with him or her and watch for signs of complete airway blockage until the food or object comes out.  If there is a complete airway blockage or if there is a partial airway blockage and the food or object does not come out, perform abdominal thrusts (also referred to as the Heimlich maneuver). Abdominal thrusts are used to create an artificial cough to try to clear the airway. Performing abdominal thrusts is part of a series of steps that should be done to help someone who is choking. Abdominal thrusts are usually done by someone else, but if you are alone, you can perform abdominal thrusts on yourself. Follow the procedure below that best fits your situation.  IF SOMEONE ELSE IS CHOKING: For a conscious adult:  1. Ask the person whether he or she is choking. If the person nods, continue to step 2. 2. Stand or kneel behind the person and lean him or her forward slightly. 3. Make a fist with 1 hand, put your arms around the person, and grasp your fist with your other hand. Place the thumb side of your fist in the person's abdomen, just below the ribs. 4. Press inward and upward with both hands. 5. Repeat this maneuver until the object comes out and the person is able to breathe or until the person loses consciousness. For an unconcious adult: 1. Shout for help. If someone responds, have him or her call local emergency services (911 in [place] If no one responds, call local emergency services yourself if possible. 2. Begin CPR, starting with compressions. Every time you open the airway to give rescue breaths, open the  person's mouth. If you can see the food or object and it can be easily pulled out, remove it with your fingers. 3. After 5 cycles or 2 minutes of CPR, call local emergency services (911 in [place] if you or someone else did not already call. For a conscious adult who is obese or in the later stages of pregnancy: Abdominal thrusts may not be effective when helping people who are in the later stages of pregnancy or who are obese. In these instances, chest thrusts can be used.  1. Ask the person whether he or she is choking. If the person nods and has signs of complete airway blockage, continue to step 2. 2. Stand behind the person and wrap your arms around his or her chest (with your arms under the person's armpits). 3. Make a fist with 1 hand. Place the thumb side of your fist on the middle of the person's breastbone. 4. Grab your fist with your other hand and thrust backward. Continue this until the object comes out or until the person becomes unconscious. For an unconscious adult who is obese or in the later stages of pregnancy:  1.  Shout for help. If someone responds, have him or her call local emergency services (911 in [place] If no one responds, call local emergency services yourself if possible. 2. Begin CPR, starting with compressions. Every time you open the airway to give rescue breaths, open the person's mouth. If you can see the food or object and it can be easily pulled out, remove it with your fingers. 3. After 5 cycles or 2 minutes of CPR, call local emergency services (911 in [place] if you or someone else did not already call. Note that abdominal thrusts (below the rib cage) should be used for a pregnant woman if possible. This should be possible until the later stages of pregnancy when there is no longer enough room between the enlarging uterus and the rib cage to perform the maneuver. At that point, chest thrusts must be used as described. IF YOU ARE CHOKING: 1. Call local emergency  services (911 in [place] if near a landline. Do not worry about communicating what is happening. Do not hang up the phone. Someone may be sent to help you anyway. 2. Make a fist with 1 hand. Put the thumb side of the fist against your stomach, just above the belly button and well below the breastbone. If you are pregnant or obese, put your fist on your chest instead, just below the breastbone and just above your lowest ribs. 3. Hold your fist with your other hand and bend over a hard surface, such as a table or chair. 4. Forcefully push your fist in and up. 5. Continue to do this until the food or object comes out. PREVENTION  To be prepared if choking occurs, learn how to correctly perform abdominal thrusts and give CPR by taking a certified first-aid training course.  SEEK IMMEDIATE MEDICAL CARE IF:  You have a fever after choking stops.  You have problems breathing after choking stops.  You received the Heimlich maneuver. MAKE SURE YOU:  Understand these instructions.  Watch your condition.  Get help right away if you are not doing well or get worse. Document Released: 08/20/2004 Document Revised: 04/06/2012 Document Reviewed: 02/23/2012 [facility] Patient Information 2014 [employer].

## 2014-04-10 ENCOUNTER — Emergency Department (HOSPITAL_COMMUNITY)
Admission: EM | Admit: 2014-04-10 | Discharge: 2014-04-10 | Disposition: A | Discharge status: Home / Self Care | Payer: Medicare HMO | Attending: Emergency Medicine | Admitting: Emergency Medicine

## 2014-04-10 ENCOUNTER — Encounter (HOSPITAL_COMMUNITY): Payer: Self-pay | Admitting: Emergency Medicine

## 2014-04-10 DIAGNOSIS — Z8719 Personal history of other diseases of the digestive system: Secondary | ICD-10-CM | POA: Diagnosis not present

## 2014-04-10 DIAGNOSIS — Z8673 Personal history of transient ischemic attack (TIA), and cerebral infarction without residual deficits: Secondary | ICD-10-CM | POA: Diagnosis not present

## 2014-04-10 DIAGNOSIS — R21 Rash and other nonspecific skin eruption: Secondary | ICD-10-CM | POA: Diagnosis present

## 2014-04-10 DIAGNOSIS — Z8739 Personal history of other diseases of the musculoskeletal system and connective tissue: Secondary | ICD-10-CM | POA: Insufficient documentation

## 2014-04-10 DIAGNOSIS — Z7982 Long term (current) use of aspirin: Secondary | ICD-10-CM | POA: Insufficient documentation

## 2014-04-10 DIAGNOSIS — F17200 Nicotine dependence, unspecified, uncomplicated: Secondary | ICD-10-CM | POA: Insufficient documentation

## 2014-04-10 DIAGNOSIS — I10 Essential (primary) hypertension: Secondary | ICD-10-CM | POA: Insufficient documentation

## 2014-04-10 DIAGNOSIS — B029 Zoster without complications: Secondary | ICD-10-CM | POA: Diagnosis not present

## 2014-04-10 LAB — I-STAT CHEM 8, ED
BUN: 13 mg/dL (ref 6–23)
Calcium, Ion: 1.19 mmol/L (ref 1.13–1.30)
Chloride: 101 mEq/L (ref 96–112)
Creatinine, Ser: 0.9 mg/dL (ref 0.50–1.10)
Glucose, Bld: 108 mg/dL — ABNORMAL HIGH (ref 70–99)
HEMATOCRIT: 44 % (ref 36.0–46.0)
HEMOGLOBIN: 15 g/dL (ref 12.0–15.0)
Potassium: 4.2 mEq/L (ref 3.7–5.3)
SODIUM: 137 meq/L (ref 137–147)
TCO2: 30 mmol/L (ref 0–100)

## 2014-04-10 MED ORDER — OXYCODONE-ACETAMINOPHEN 5-325 MG PO TABS: 1.0000 | ORAL_TABLET | ORAL | Status: DC | PRN

## 2014-04-10 MED ORDER — ACYCLOVIR 400 MG PO TABS: 400.0000 mg | ORAL_TABLET | Freq: Four times a day (QID) | ORAL | Status: AC

## 2014-04-10 MED ORDER — ACETAMINOPHEN 325 MG PO TABS
650.0000 mg | ORAL_TABLET | Freq: Once | ORAL | Status: AC
  Administered 2014-04-10: 650 mg via ORAL
  Filled 2014-04-10: qty 2

## 2014-04-10 NOTE — ED Notes (Signed)
Noticed red area to left side of chest at breast.   Red blistery areas  to left breast going around to left side of breast.

## 2014-04-10 NOTE — Discharge Instructions (Signed)

## 2014-04-10 NOTE — ED Provider Notes (Signed)
CSN: [id number]     Arrival date & time 04/10/14  0350 History  This chart was scribed for Sharyon Cable, MD by Ludger Nutting, ED Scribe. This patient was seen in room [facility]07[facility] and the patient's care was started 7:49 AM.    Chief Complaint  Patient presents with  . Rash    Patient is a 68 y.o. female presenting with rash. The history is provided by the patient. No language interpreter was used.  Rash Location:  Torso Torso rash location:  L chest Quality: blistering and redness   Severity:  Mild Onset quality:  Gradual Duration:  1 day Timing:  Constant Chronicity:  New Relieved by:  None tried Worsened by:  Nothing tried Ineffective treatments:  None tried Associated symptoms: no abdominal pain, no fever and no shortness of breath     HPI Comments: [name] is a 68 y.o. female who presents to the Emergency Department complaining of a constant rash to the left breast and left lateral chest with associated left shoulder blade pain that she noticed this morning. She describes this rash as patches of red blisters. She denies fever, SOB, abdominal pain, back pain.   Past Medical History  Diagnosis Date  . GERD (gastroesophageal reflux disease)   . Dysphagia   . Weakness of left leg     and left arm from a CVA  . Hypertension   . CVA (cerebral infarction) 09/2010  . CVA (cerebral infarction)     in March of this year which she describes as slight.  . Chronic constipation   . Stroke    Past Surgical History  Procedure Laterality Date  . Total abdominal hysterectomy    . Cystectomy      shoulder  . Colonoscopy N/A 03/01/2013    Procedure: COLONOSCOPY;  Surgeon: Rogene Houston, MD;  Location: [facility];  Service: Endoscopy;  Laterality: N/A;  23   Family History  Problem Relation Age of Onset  . Colon cancer Neg Hx   . Colon polyps Sister    History  Substance Use Topics  . Smoking status: Current Every Day Smoker -- 0.25 packs/day for 20 years     Types: Cigarettes  . Smokeless tobacco: Not on file     Comment: 3 cigarettes a day  . Alcohol Use: No   OB History   Grav Para Term Preterm Abortions TAB SAB Ect Mult Living                 Review of Systems  Constitutional: Negative for fever.  Respiratory: Negative for shortness of breath.   Gastrointestinal: Negative for abdominal pain.  Musculoskeletal: Negative for back pain.  Skin: Positive for rash.      Allergies  Review of patient's allergies indicates no known allergies.  Home Medications   Prior to Admission medications   Medication Sig Start Date End Date Taking? Authorizing Provider  aspirin EC 81 MG tablet Take 81 mg by mouth every morning.     Historical Provider, MD  Cholecalciferol (VITAMIN D PO) Take 1 capsule by mouth every morning.    Historical Provider, MD  UNKNOWN TO PATIENT Take 1 tablet by mouth every morning. BP/FLUID COMBO    Historical Provider, MD  UNKNOWN TO PATIENT Take 1 tablet by mouth every morning. FLUID MEDICATION    Historical Provider, MD   BP 141/79  Pulse 80  Temp(Src) 98 F (36.7 C) (Oral)  Resp 18  Ht 5\' 3"  (1.6 m)  Wt 177 lb (80.287 kg)  BMI 31.36 kg/m2  SpO2 100% Physical Exam  Nursing note and vitals reviewed.  CONSTITUTIONAL: Well developed/well nourished HEAD: Normocephalic/atraumatic EYES: EOMI/PERRL ENMT: Mucous membranes moist NECK: supple no meningeal signs SPINE:entire spine nontender CV: S1/S2 noted, no murmurs/rubs/gallops noted LUNGS: Lungs are clear to auscultation bilaterally, no apparent distress ABDOMEN: soft, nontender, no rebound or guarding GU:no cva tenderness NEURO: Pt is awake/alert, moves all extremitiesx4 EXTREMITIES: pulses normal, full ROM SKIN: warm, color normal, vesicular rash extends from back to left breast in dermatomal fashion  PSYCH: no abnormalities of mood noted   ED Course  Procedures  DIAGNOSTIC STUDIES: Oxygen Saturation is 100% on RA, normal by my interpretation.     COORDINATION OF CARE: 7:54 AM Discussed treatment plan with pt at bedside and pt agreed to plan.   Labs Review Labs Reviewed  I-STAT CHEM 8, ED - Abnormal; Notable for the following:    Glucose, Bld 108 (*)    All other components within normal limits      MDM   Final diagnoses:  Herpes zoster    Nursing notes including past medical history and social history reviewed and considered in documentation Labs/vital reviewed and considered   I personally performed the services described in this documentation, which was scribed in my presence. The recorded information has been reviewed and is accurate.       Sharyon Cable, MD 04/10/14 [phone]

## 2014-08-09 ENCOUNTER — Encounter (HOSPITAL_COMMUNITY): Payer: Self-pay | Admitting: Internal Medicine

## 2014-10-10 DIAGNOSIS — I10 Essential (primary) hypertension: Secondary | ICD-10-CM | POA: Diagnosis not present

## 2014-10-10 DIAGNOSIS — G4700 Insomnia, unspecified: Secondary | ICD-10-CM | POA: Diagnosis not present

## 2014-10-10 DIAGNOSIS — E663 Overweight: Secondary | ICD-10-CM | POA: Diagnosis not present

## 2014-10-10 DIAGNOSIS — F17210 Nicotine dependence, cigarettes, uncomplicated: Secondary | ICD-10-CM | POA: Diagnosis not present

## 2015-01-09 DIAGNOSIS — F17210 Nicotine dependence, cigarettes, uncomplicated: Secondary | ICD-10-CM | POA: Diagnosis not present

## 2015-01-09 DIAGNOSIS — I69928 Other speech and language deficits following unspecified cerebrovascular disease: Secondary | ICD-10-CM | POA: Diagnosis not present

## 2015-01-09 DIAGNOSIS — I10 Essential (primary) hypertension: Secondary | ICD-10-CM | POA: Diagnosis not present

## 2015-01-09 DIAGNOSIS — G4700 Insomnia, unspecified: Secondary | ICD-10-CM | POA: Diagnosis not present

## 2015-02-26 ENCOUNTER — Encounter (HOSPITAL_COMMUNITY): Payer: Self-pay | Admitting: Emergency Medicine

## 2015-02-26 DIAGNOSIS — M25552 Pain in left hip: Secondary | ICD-10-CM | POA: Diagnosis present

## 2015-02-26 DIAGNOSIS — Z8719 Personal history of other diseases of the digestive system: Secondary | ICD-10-CM | POA: Diagnosis not present

## 2015-02-26 DIAGNOSIS — Z79899 Other long term (current) drug therapy: Secondary | ICD-10-CM | POA: Diagnosis not present

## 2015-02-26 DIAGNOSIS — Z8673 Personal history of transient ischemic attack (TIA), and cerebral infarction without residual deficits: Secondary | ICD-10-CM | POA: Diagnosis not present

## 2015-02-26 DIAGNOSIS — Z7982 Long term (current) use of aspirin: Secondary | ICD-10-CM | POA: Diagnosis not present

## 2015-02-26 DIAGNOSIS — Z720 Tobacco use: Secondary | ICD-10-CM | POA: Diagnosis not present

## 2015-02-26 DIAGNOSIS — I10 Essential (primary) hypertension: Secondary | ICD-10-CM | POA: Diagnosis not present

## 2015-02-26 DIAGNOSIS — M5432 Sciatica, left side: Secondary | ICD-10-CM | POA: Diagnosis not present

## 2015-02-26 NOTE — ED Notes (Signed)
Pt c/o left hip pain and swelling. Pt states he has been bumping into thing over the last couple of days.

## 2015-02-27 ENCOUNTER — Emergency Department (HOSPITAL_COMMUNITY)
Admission: EM | Admit: 2015-02-27 | Discharge: 2015-02-27 | Disposition: A | Discharge status: Home / Self Care | Payer: Commercial Managed Care - HMO | Attending: Emergency Medicine | Admitting: Emergency Medicine

## 2015-02-27 DIAGNOSIS — M5432 Sciatica, left side: Secondary | ICD-10-CM | POA: Diagnosis not present

## 2015-02-27 DIAGNOSIS — Z720 Tobacco use: Secondary | ICD-10-CM | POA: Diagnosis not present

## 2015-02-27 DIAGNOSIS — Z8719 Personal history of other diseases of the digestive system: Secondary | ICD-10-CM | POA: Diagnosis not present

## 2015-02-27 DIAGNOSIS — Z79899 Other long term (current) drug therapy: Secondary | ICD-10-CM | POA: Diagnosis not present

## 2015-02-27 DIAGNOSIS — Z8673 Personal history of transient ischemic attack (TIA), and cerebral infarction without residual deficits: Secondary | ICD-10-CM | POA: Diagnosis not present

## 2015-02-27 DIAGNOSIS — Z7982 Long term (current) use of aspirin: Secondary | ICD-10-CM | POA: Diagnosis not present

## 2015-02-27 DIAGNOSIS — I10 Essential (primary) hypertension: Secondary | ICD-10-CM | POA: Diagnosis not present

## 2015-02-27 MED ORDER — DEXAMETHASONE SODIUM PHOSPHATE 10 MG/ML IJ SOLN
10.0000 mg | Freq: Once | INTRAMUSCULAR | Status: AC
  Administered 2015-02-27: 10 mg via INTRAMUSCULAR
  Filled 2015-02-27: qty 1

## 2015-02-27 MED ORDER — HYDROCODONE-ACETAMINOPHEN 5-325 MG PO TABS: 1.0000 | ORAL_TABLET | ORAL | Status: DC | PRN

## 2015-02-27 NOTE — ED Provider Notes (Signed)
CSN: [id number]     Arrival date & time 02/26/15  2252 History  This chart was scribed for Orpah Greek, MD by Irene Pap, ED Scribe. This patient was seen in room [facility]08/[facility]08 and patient care was started at 12:13 AM.   Chief Complaint  Patient presents with  . Hip Pain   The history is provided by the patient. No language interpreter was used.  HPI Comments: [name] is a 69 y.o. female with hx of left leg weakness, stroke, and CVA who presents to the Emergency Department complaining of left hip pain onset one week ago. States that it starts in her back and wraps around to the front, and radiates down to the back of the leg. States that she has trouble with ambulation. Reports hx of similar symptoms a long time ago, but denies it being sciatica or pinched nerve pain. Denies bladder or bowel incontinence  Past Medical History  Diagnosis Date  . GERD (gastroesophageal reflux disease)   . Dysphagia   . Weakness of left leg     and left arm from a CVA  . Hypertension   . CVA (cerebral infarction) 09/2010  . CVA (cerebral infarction)     in March of this year which she describes as slight.  . Chronic constipation   . Stroke    Past Surgical History  Procedure Laterality Date  . Total abdominal hysterectomy    . Cystectomy      shoulder  . Colonoscopy N/A 03/01/2013    Procedure: COLONOSCOPY;  Surgeon: Rogene Houston, MD;  Location: [facility];  Service: Endoscopy;  Laterality: N/A;  32   Family History  Problem Relation Age of Onset  . Colon cancer Neg Hx   . Colon polyps Sister    History  Substance Use Topics  . Smoking status: Current Every Day Smoker -- 0.25 packs/day for 20 years    Types: Cigarettes  . Smokeless tobacco: Not on file     Comment: 3 cigarettes a day  . Alcohol Use: No   OB History    No data available     Review of Systems  Musculoskeletal: Positive for arthralgias and gait problem.  All other systems reviewed and are  negative.  Allergies  Review of patient's allergies indicates no known allergies.  Home Medications   Prior to Admission medications   Medication Sig Start Date End Date Taking? Authorizing Provider  acyclovir (ZOVIRAX) 400 MG tablet Take 1 tablet (400 mg total) by mouth 4 (four) times daily. 04/10/14   Ripley Fraise, MD  aspirin EC 81 MG tablet Take 81 mg by mouth every morning.     Historical Provider, MD  Cholecalciferol (VITAMIN D PO) Take 1 capsule by mouth every morning.    Historical Provider, MD  oxyCODONE-acetaminophen (PERCOCET/ROXICET) 5-325 MG per tablet Take 1 tablet by mouth every 4 (four) hours as needed for severe pain. 04/10/14   Ripley Fraise, MD  UNKNOWN TO PATIENT Take 1 tablet by mouth every morning. BP/FLUID COMBO    Historical Provider, MD  UNKNOWN TO PATIENT Take 1 tablet by mouth every morning. FLUID MEDICATION    Historical Provider, MD   BP 150/87 mmHg  Pulse 80  Temp(Src) 97.8 F (36.6 C) (Oral)  Resp 18  Wt 177 lb (80.287 kg)  SpO2 100%  Physical Exam  Constitutional: She is oriented to person, place, and time. She appears well-developed and well-nourished. No distress.  HENT:  Head: Normocephalic and atraumatic.  Right Ear: Hearing normal.  Left Ear: Hearing normal.  Nose: Nose normal.  Mouth/Throat: Oropharynx is clear and moist and mucous membranes are normal.  Eyes: Conjunctivae and EOM are normal. Pupils are equal, round, and reactive to light.  Neck: Normal range of motion. Neck supple.  Cardiovascular: Regular rhythm, S1 normal and S2 normal.  Exam reveals no gallop and no friction rub.   No murmur heard. Pulmonary/Chest: Effort normal and breath sounds normal. No respiratory distress. She exhibits no tenderness.  Abdominal: Soft. Normal appearance and bowel sounds are normal. There is no hepatosplenomegaly. There is no tenderness. There is no rebound, no guarding, no tenderness at McBurney's point and negative Murphy's sign. No hernia.   Musculoskeletal: Normal range of motion.  Antalgic gait and pain with flexion of left hip; normal strength, sensation and reflexes  Neurological: She is alert and oriented to person, place, and time. She has normal strength. No cranial nerve deficit or sensory deficit. GCS eye subscore is 4. GCS verbal subscore is 5. GCS motor subscore is 6.  Skin: Skin is warm, dry and intact. No rash noted. No cyanosis.  Psychiatric: She has a normal mood and affect. Her speech is normal and behavior is normal. Thought content normal.  Nursing note and vitals reviewed.   ED Course  Procedures (including critical care time) DIAGNOSTIC STUDIES: Oxygen Saturation is 100% on RA, normal by my interpretation.    COORDINATION OF CARE: 12:16 AM-Discussed treatment plan which includes cortisone steroid shot and pain medication with pt at bedside and pt agreed to plan.   Labs Review Labs Reviewed - No data to display  Imaging Review No results found.   EKG Interpretation None      MDM   Final diagnoses:  None   sciatica  Patient presents to the emergency department for evaluation of pain behind her left hip radiating down the left upper leg. Symptoms present for 1 week. She reports exacerbation with walking and changing positions. Examination reveals pain with flexion of the hip, but she has normal strength. No neurologic deficits. Symptoms are consistent with sciatica. No further testing necessary.  I personally performed the services described in this documentation, which was scribed in my presence. The recorded information has been reviewed and is accurate.     Orpah Greek, MD 02/27/15 [phone]

## 2015-03-05 ENCOUNTER — Ambulatory Visit (HOSPITAL_COMMUNITY)
Admission: RE | Admit: 2015-03-05 | Discharge: 2015-03-05 | Disposition: A | Discharge status: Home / Self Care | Payer: Commercial Managed Care - HMO | Source: Ambulatory Visit | Attending: Family Medicine | Admitting: Family Medicine

## 2015-03-05 ENCOUNTER — Other Ambulatory Visit (HOSPITAL_COMMUNITY): Payer: Self-pay | Admitting: Family Medicine

## 2015-03-05 DIAGNOSIS — R262 Difficulty in walking, not elsewhere classified: Secondary | ICD-10-CM

## 2015-03-05 DIAGNOSIS — M25552 Pain in left hip: Secondary | ICD-10-CM

## 2015-09-23 DIAGNOSIS — I10 Essential (primary) hypertension: Secondary | ICD-10-CM | POA: Diagnosis not present

## 2015-09-23 DIAGNOSIS — I69354 Hemiplegia and hemiparesis following cerebral infarction affecting left non-dominant side: Secondary | ICD-10-CM | POA: Diagnosis not present

## 2015-09-23 DIAGNOSIS — G4700 Insomnia, unspecified: Secondary | ICD-10-CM | POA: Diagnosis not present

## 2015-09-23 DIAGNOSIS — I69323 Fluency disorder following cerebral infarction: Secondary | ICD-10-CM | POA: Diagnosis not present

## 2015-10-01 ENCOUNTER — Other Ambulatory Visit (HOSPITAL_COMMUNITY): Payer: Self-pay | Admitting: Family Medicine

## 2015-10-01 DIAGNOSIS — Z1231 Encounter for screening mammogram for malignant neoplasm of breast: Secondary | ICD-10-CM

## 2015-10-01 DIAGNOSIS — Z780 Asymptomatic menopausal state: Secondary | ICD-10-CM

## 2015-10-10 ENCOUNTER — Ambulatory Visit (HOSPITAL_COMMUNITY)
Admission: RE | Admit: 2015-10-10 | Discharge: 2015-10-10 | Disposition: A | Discharge status: Home / Self Care | Payer: Commercial Managed Care - HMO | Source: Ambulatory Visit | Attending: Family Medicine | Admitting: Family Medicine

## 2015-10-10 DIAGNOSIS — Z780 Asymptomatic menopausal state: Secondary | ICD-10-CM | POA: Insufficient documentation

## 2015-10-10 DIAGNOSIS — Z720 Tobacco use: Secondary | ICD-10-CM | POA: Diagnosis not present

## 2015-10-10 DIAGNOSIS — Z1231 Encounter for screening mammogram for malignant neoplasm of breast: Secondary | ICD-10-CM | POA: Diagnosis not present

## 2015-12-27 ENCOUNTER — Encounter (HOSPITAL_COMMUNITY): Payer: Self-pay | Admitting: *Deleted

## 2015-12-27 ENCOUNTER — Emergency Department (HOSPITAL_COMMUNITY)
Admission: EM | Admit: 2015-12-27 | Discharge: 2015-12-27 | Disposition: A | Discharge status: Home / Self Care | Payer: Medicare HMO | Attending: Emergency Medicine | Admitting: Emergency Medicine

## 2015-12-27 ENCOUNTER — Emergency Department (HOSPITAL_COMMUNITY): Payer: Medicare HMO

## 2015-12-27 DIAGNOSIS — Z8673 Personal history of transient ischemic attack (TIA), and cerebral infarction without residual deficits: Secondary | ICD-10-CM | POA: Insufficient documentation

## 2015-12-27 DIAGNOSIS — R1010 Upper abdominal pain, unspecified: Secondary | ICD-10-CM | POA: Insufficient documentation

## 2015-12-27 DIAGNOSIS — I10 Essential (primary) hypertension: Secondary | ICD-10-CM | POA: Insufficient documentation

## 2015-12-27 DIAGNOSIS — F17210 Nicotine dependence, cigarettes, uncomplicated: Secondary | ICD-10-CM | POA: Diagnosis not present

## 2015-12-27 DIAGNOSIS — R200 Anesthesia of skin: Secondary | ICD-10-CM | POA: Diagnosis not present

## 2015-12-27 DIAGNOSIS — Z79899 Other long term (current) drug therapy: Secondary | ICD-10-CM | POA: Insufficient documentation

## 2015-12-27 DIAGNOSIS — Z7982 Long term (current) use of aspirin: Secondary | ICD-10-CM | POA: Insufficient documentation

## 2015-12-27 DIAGNOSIS — M792 Neuralgia and neuritis, unspecified: Secondary | ICD-10-CM | POA: Insufficient documentation

## 2015-12-27 DIAGNOSIS — R69 Illness, unspecified: Secondary | ICD-10-CM | POA: Diagnosis present

## 2015-12-27 MED ORDER — NAPROXEN 500 MG PO TABS: 500.0000 mg | ORAL_TABLET | Freq: Two times a day (BID) | ORAL | Status: DC

## 2015-12-27 NOTE — Discharge Instructions (Signed)
Follow up with your md next week. °

## 2015-12-27 NOTE — ED Provider Notes (Signed)
CSN: [id number]     Arrival date & time 12/27/15  [id number] History   First MD Initiated Contact with Patient 12/27/15 0715     Chief Complaint  Patient presents with  . Arm Problem     (Consider location/radiation/quality/duration/timing/severity/associated sxs/prior Treatment) Patient is a 70 y.o. female presenting with general illness. The history is provided by the patient (The patient states she has numbness in her left arm.  for a couple weeks.).  Illness Severity:  Mild Onset quality:  Sudden Timing:  Constant Progression:  Unchanged Chronicity:  New Associated symptoms: abdominal pain   Associated symptoms: no chest pain, no congestion, no cough, no diarrhea, no fatigue, no headaches and no rash     Past Medical History  Diagnosis Date  . GERD (gastroesophageal reflux disease)   . Dysphagia   . Weakness of left leg     and left arm from a CVA  . Hypertension   . CVA (cerebral infarction) 09/2010  . CVA (cerebral infarction)     in March of this year which she describes as slight.  . Chronic constipation   . Stroke [facility])    Past Surgical History  Procedure Laterality Date  . Total abdominal hysterectomy    . Cystectomy      shoulder  . Colonoscopy N/A 03/01/2013    Procedure: COLONOSCOPY;  Surgeon: Rogene Houston, MD;  Location: [facility];  Service: Endoscopy;  Laterality: N/A;  54   Family History  Problem Relation Age of Onset  . Colon cancer Neg Hx   . Colon polyps Sister    Social History  Substance Use Topics  . Smoking status: Current Every Day Smoker -- 0.25 packs/day for 20 years    Types: Cigarettes  . Smokeless tobacco: None     Comment: 3 cigarettes a day  . Alcohol Use: No   OB History    No data available     Review of Systems  Constitutional: Negative for appetite change and fatigue.  HENT: Negative for congestion, ear discharge and sinus pressure.   Eyes: Negative for discharge.  Respiratory: Negative for cough.   Cardiovascular:  Negative for chest pain.  Gastrointestinal: Positive for abdominal pain. Negative for diarrhea.  Genitourinary: Negative for frequency and hematuria.  Musculoskeletal: Negative for back pain.  Skin: Negative for rash.  Neurological: Positive for numbness. Negative for seizures and headaches.       Numbness in left hand and arm  Psychiatric/Behavioral: Negative for hallucinations.      Allergies  Review of patient's allergies indicates no known allergies.  Home Medications   Prior to Admission medications   Medication Sig Start Date End Date Taking? Authorizing Provider  acyclovir (ZOVIRAX) 400 MG tablet Take 1 tablet (400 mg total) by mouth 4 (four) times daily. 04/10/14   Ripley Fraise, MD  aspirin EC 81 MG tablet Take 81 mg by mouth every morning.     Historical Provider, MD  Cholecalciferol (VITAMIN D PO) Take 1 capsule by mouth every morning.    Historical Provider, MD  HYDROcodone-acetaminophen (NORCO/VICODIN) 5-325 MG per tablet Take 1 tablet by mouth every 4 (four) hours as needed for moderate pain. 02/27/15   Orpah Greek, MD  naproxen (NAPROSYN) 500 MG tablet Take 1 tablet (500 mg total) by mouth 2 (two) times daily. 12/27/15   Milton Ferguson, MD  oxyCODONE-acetaminophen (PERCOCET/ROXICET) 5-325 MG per tablet Take 1 tablet by mouth every 4 (four) hours as needed for severe pain. 04/10/14  Ripley Fraise, MD  UNKNOWN TO PATIENT Take 1 tablet by mouth every morning. BP/FLUID COMBO    Historical Provider, MD  UNKNOWN TO PATIENT Take 1 tablet by mouth every morning. FLUID MEDICATION    Historical Provider, MD   BP 144/92 mmHg  Pulse 80  Temp(Src) 97.7 F (36.5 C) (Oral)  Resp 16  Ht 5\' 3"  (1.6 m)  Wt 177 lb (80.287 kg)  BMI 31.36 kg/m2  SpO2 100% Physical Exam  Constitutional: She is oriented to person, place, and time. She appears well-developed.  HENT:  Head: Normocephalic.  Eyes: Conjunctivae are normal.  Neck: No tracheal deviation present.  Cardiovascular:   No murmur heard. Musculoskeletal: Normal range of motion.  Patient has decreased sensation left arm. Pulses normal in both upper extremities. Strength normal in both upper extremities  Neurological: She is oriented to person, place, and time.  Skin: Skin is warm.  Psychiatric: She has a normal mood and affect.    ED Course  Procedures (including critical care time) Labs Review Labs Reviewed - No data to display  Imaging Review Ct Head Wo Contrast  12/27/2015  CLINICAL DATA:  Left arm tingling beginning 1 week ago. Previous history of stroke in the past. EXAM: CT HEAD WITHOUT CONTRAST CT CERVICAL SPINE WITHOUT CONTRAST TECHNIQUE: Multidetector CT imaging of the head and cervical spine was performed following the standard protocol without intravenous contrast. Multiplanar CT image reconstructions of the cervical spine were also generated. COMPARISON:  09/30/2010 FINDINGS: CT HEAD FINDINGS The brain does not show accelerated atrophy. There are mild areas of low density in the cerebral hemispheric deep white matter consistent with chronic small vessel disease. No sign of acute infarction, mass lesion, hemorrhage, hydrocephalus or extra-axial collection. Old pontine infarction not visible by CT. No calvarial abnormality. Sinuses, middle ears and mastoids are clear. CT CERVICAL SPINE FINDINGS Alignment is normal. There is chronic degenerative spondylosis at C3-4, C4-5, C5-6 and C6-7 with osteophytic encroachment upon the canal and foramina. There is a small amount of nitrogen gas in the left posterior lateral direction at C4-5 extending into the foramen. This could indicate a small left posterior lateral disc herniation that could affect the left C5 nerve root. Osteophytic foraminal encroachment at C5-6 could possibly be symptomatic. Osteophytic encroachment at C6-7 could possibly be symptomatic. IMPRESSION: Head CT: No acute finding. Chronic small-vessel changes of the cerebral hemispheric deep white  matter. Cervical spine CT: No acute or traumatic finding. Degenerative spondylosis from C3-4 through C6-7. Foraminal narrowing that could cause neural compression at C5-6 and C6-7. Some nitrogen gas visible in the left posterior lateral direction and left foramen at C4-5, a finding that can be seen in association with small disc herniations. If present, this could affect the left C5 nerve root. Electronically Signed   By: Nelson Chimes M.D.   On: 12/27/2015 09:15   Ct Cervical Spine Wo Contrast  12/27/2015  CLINICAL DATA:  Left arm tingling beginning 1 week ago. Previous history of stroke in the past. EXAM: CT HEAD WITHOUT CONTRAST CT CERVICAL SPINE WITHOUT CONTRAST TECHNIQUE: Multidetector CT imaging of the head and cervical spine was performed following the standard protocol without intravenous contrast. Multiplanar CT image reconstructions of the cervical spine were also generated. COMPARISON:  09/30/2010 FINDINGS: CT HEAD FINDINGS The brain does not show accelerated atrophy. There are mild areas of low density in the cerebral hemispheric deep white matter consistent with chronic small vessel disease. No sign of acute infarction, mass lesion, hemorrhage, hydrocephalus or extra-axial  collection. Old pontine infarction not visible by CT. No calvarial abnormality. Sinuses, middle ears and mastoids are clear. CT CERVICAL SPINE FINDINGS Alignment is normal. There is chronic degenerative spondylosis at C3-4, C4-5, C5-6 and C6-7 with osteophytic encroachment upon the canal and foramina. There is a small amount of nitrogen gas in the left posterior lateral direction at C4-5 extending into the foramen. This could indicate a small left posterior lateral disc herniation that could affect the left C5 nerve root. Osteophytic foraminal encroachment at C5-6 could possibly be symptomatic. Osteophytic encroachment at C6-7 could possibly be symptomatic. IMPRESSION: Head CT: No acute finding. Chronic small-vessel changes of the  cerebral hemispheric deep white matter. Cervical spine CT: No acute or traumatic finding. Degenerative spondylosis from C3-4 through C6-7. Foraminal narrowing that could cause neural compression at C5-6 and C6-7. Some nitrogen gas visible in the left posterior lateral direction and left foramen at C4-5, a finding that can be seen in association with small disc herniations. If present, this could affect the left C5 nerve root. Electronically Signed   By: Nelson Chimes M.D.   On: 12/27/2015 09:15   I have personally reviewed and evaluated these images and lab results as part of my medical decision-making.   EKG Interpretation None      MDM   Final diagnoses:  Neuritis    MRI shows possible C5 nerve root impingement. Patient put on Naprosyn and will follow-up with her PCP. She may need an MRI or neurosurgical referral    Milton Ferguson, MD 12/27/15 [phone]

## 2015-12-27 NOTE — ED Notes (Signed)
Pt comes in in with left arm tingling that started 1 week ago. Pt denies any injury to this arm or her neck. Pt denies any unilateral weakness. Pt has hx of stroke that involve left arm. Pt denies any pain in chest or in arm.

## 2018-02-23 ENCOUNTER — Encounter (INDEPENDENT_AMBULATORY_CARE_PROVIDER_SITE_OTHER): Payer: Self-pay | Admitting: *Deleted

## 2018-09-12 ENCOUNTER — Other Ambulatory Visit (HOSPITAL_COMMUNITY): Payer: Self-pay | Admitting: Family Medicine

## 2018-09-12 DIAGNOSIS — Z1231 Encounter for screening mammogram for malignant neoplasm of breast: Secondary | ICD-10-CM

## 2018-09-23 ENCOUNTER — Ambulatory Visit (HOSPITAL_COMMUNITY): Payer: Medicare HMO

## 2020-03-11 ENCOUNTER — Ambulatory Visit (HOSPITAL_COMMUNITY)
Admission: RE | Admit: 2020-03-11 | Discharge: 2020-03-11 | Disposition: A | Discharge status: Home / Self Care | Payer: Medicare HMO | Source: Ambulatory Visit | Attending: Nurse Practitioner | Admitting: Nurse Practitioner

## 2020-03-11 ENCOUNTER — Other Ambulatory Visit: Payer: Self-pay

## 2020-03-11 ENCOUNTER — Other Ambulatory Visit (HOSPITAL_COMMUNITY): Payer: Self-pay | Admitting: Nurse Practitioner

## 2020-03-11 DIAGNOSIS — M1712 Unilateral primary osteoarthritis, left knee: Secondary | ICD-10-CM | POA: Diagnosis not present

## 2020-03-11 DIAGNOSIS — W19XXXA Unspecified fall, initial encounter: Secondary | ICD-10-CM

## 2020-03-11 DIAGNOSIS — M25562 Pain in left knee: Secondary | ICD-10-CM

## 2020-03-12 ENCOUNTER — Other Ambulatory Visit (HOSPITAL_COMMUNITY): Payer: Self-pay | Admitting: Nurse Practitioner

## 2020-03-12 DIAGNOSIS — R011 Cardiac murmur, unspecified: Secondary | ICD-10-CM

## 2020-03-14 ENCOUNTER — Ambulatory Visit (HOSPITAL_COMMUNITY): Admission: RE | Admit: 2020-03-14 | Payer: Medicare HMO | Source: Ambulatory Visit

## 2020-07-22 ENCOUNTER — Other Ambulatory Visit (HOSPITAL_COMMUNITY): Payer: Self-pay | Admitting: Family Medicine

## 2020-07-22 DIAGNOSIS — M545 Low back pain, unspecified: Secondary | ICD-10-CM

## 2020-08-08 ENCOUNTER — Ambulatory Visit (HOSPITAL_COMMUNITY)
Admission: RE | Admit: 2020-08-08 | Discharge: 2020-08-08 | Disposition: A | Discharge status: Home / Self Care | Payer: Medicare PPO | Source: Ambulatory Visit | Attending: Family Medicine | Admitting: Family Medicine

## 2020-08-08 ENCOUNTER — Other Ambulatory Visit: Payer: Self-pay

## 2020-08-08 DIAGNOSIS — M545 Low back pain, unspecified: Secondary | ICD-10-CM | POA: Insufficient documentation

## 2020-08-27 ENCOUNTER — Other Ambulatory Visit (HOSPITAL_COMMUNITY): Payer: Self-pay | Admitting: Nurse Practitioner

## 2020-08-27 ENCOUNTER — Other Ambulatory Visit: Payer: Self-pay

## 2020-08-27 ENCOUNTER — Ambulatory Visit (HOSPITAL_COMMUNITY)
Admission: RE | Admit: 2020-08-27 | Discharge: 2020-08-27 | Disposition: A | Discharge status: Home / Self Care | Payer: Medicare PPO | Source: Ambulatory Visit | Attending: Nurse Practitioner | Admitting: Nurse Practitioner

## 2020-08-27 DIAGNOSIS — M545 Low back pain, unspecified: Secondary | ICD-10-CM | POA: Diagnosis not present

## 2020-09-24 ENCOUNTER — Other Ambulatory Visit (HOSPITAL_COMMUNITY): Payer: Self-pay | Admitting: Family Medicine

## 2020-09-24 DIAGNOSIS — Z780 Asymptomatic menopausal state: Secondary | ICD-10-CM

## 2020-10-04 ENCOUNTER — Other Ambulatory Visit: Payer: Self-pay

## 2020-10-04 ENCOUNTER — Ambulatory Visit (HOSPITAL_COMMUNITY)
Admission: RE | Admit: 2020-10-04 | Discharge: 2020-10-04 | Disposition: A | Discharge status: Home / Self Care | Payer: Medicare PPO | Source: Ambulatory Visit | Attending: Family Medicine | Admitting: Family Medicine

## 2020-10-04 DIAGNOSIS — Z780 Asymptomatic menopausal state: Secondary | ICD-10-CM | POA: Insufficient documentation

## 2021-05-25 ENCOUNTER — Encounter (HOSPITAL_COMMUNITY): Payer: Self-pay

## 2021-05-25 ENCOUNTER — Emergency Department (HOSPITAL_COMMUNITY)
Admission: EM | Admit: 2021-05-25 | Discharge: 2021-05-25 | Disposition: A | Discharge status: Home / Self Care | Payer: Medicare PPO | Attending: Student | Admitting: Student

## 2021-05-25 ENCOUNTER — Emergency Department (HOSPITAL_COMMUNITY): Payer: Medicare PPO

## 2021-05-25 DIAGNOSIS — I10 Essential (primary) hypertension: Secondary | ICD-10-CM | POA: Diagnosis not present

## 2021-05-25 DIAGNOSIS — F17210 Nicotine dependence, cigarettes, uncomplicated: Secondary | ICD-10-CM | POA: Diagnosis not present

## 2021-05-25 DIAGNOSIS — R051 Acute cough: Secondary | ICD-10-CM | POA: Diagnosis not present

## 2021-05-25 DIAGNOSIS — M25562 Pain in left knee: Secondary | ICD-10-CM | POA: Diagnosis not present

## 2021-05-25 DIAGNOSIS — Z7982 Long term (current) use of aspirin: Secondary | ICD-10-CM | POA: Diagnosis not present
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
## 2021-05-25 NOTE — Discharge Instructions (Addendum)
Your work-up is normal today.  Try and keep your kitchen ventilated for the rest of the day.   Your x-ray shows that you have some arthritis, but no fractures.  You may speak with your primary care provider about this if you would like however this is a normal finding as we age.
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
## 2022-09-09 ENCOUNTER — Other Ambulatory Visit (HOSPITAL_COMMUNITY): Payer: Self-pay | Admitting: Family Medicine

## 2022-09-09 DIAGNOSIS — M25551 Pain in right hip: Secondary | ICD-10-CM

## 2022-09-09 DIAGNOSIS — Z1231 Encounter for screening mammogram for malignant neoplasm of breast: Secondary | ICD-10-CM

## 2022-09-09 DIAGNOSIS — M549 Dorsalgia, unspecified: Secondary | ICD-10-CM

## 2022-09-14 ENCOUNTER — Encounter (HOSPITAL_COMMUNITY): Payer: Self-pay

## 2022-09-14 ENCOUNTER — Ambulatory Visit (HOSPITAL_COMMUNITY)
Admission: RE | Admit: 2022-09-14 | Discharge: 2022-09-14 | Disposition: A | Discharge status: Home / Self Care | Payer: Medicare HMO | Source: Ambulatory Visit | Attending: Family Medicine | Admitting: Family Medicine

## 2022-09-14 DIAGNOSIS — Z1231 Encounter for screening mammogram for malignant neoplasm of breast: Secondary | ICD-10-CM

## 2022-09-14 DIAGNOSIS — M549 Dorsalgia, unspecified: Secondary | ICD-10-CM | POA: Insufficient documentation

## 2022-09-14 DIAGNOSIS — M25552 Pain in left hip: Secondary | ICD-10-CM | POA: Insufficient documentation

## 2022-09-14 DIAGNOSIS — M25551 Pain in right hip: Secondary | ICD-10-CM | POA: Insufficient documentation

## 2022-11-03 ENCOUNTER — Emergency Department (HOSPITAL_COMMUNITY): Payer: Medicare HMO

## 2022-11-03 ENCOUNTER — Other Ambulatory Visit: Payer: Self-pay

## 2022-11-03 ENCOUNTER — Emergency Department (HOSPITAL_COMMUNITY)
Admission: EM | Admit: 2022-11-03 | Discharge: 2022-11-03 | Disposition: A | Discharge status: Home / Self Care | Payer: Medicare HMO | Attending: Emergency Medicine | Admitting: Emergency Medicine

## 2022-11-03 ENCOUNTER — Encounter (HOSPITAL_COMMUNITY): Payer: Self-pay | Admitting: Emergency Medicine

## 2022-11-03 DIAGNOSIS — R1012 Left upper quadrant pain: Secondary | ICD-10-CM | POA: Diagnosis not present

## 2022-11-03 DIAGNOSIS — F17210 Nicotine dependence, cigarettes, uncomplicated: Secondary | ICD-10-CM | POA: Insufficient documentation

## 2022-11-03 DIAGNOSIS — E871 Hypo-osmolality and hyponatremia: Secondary | ICD-10-CM | POA: Insufficient documentation

## 2022-11-03 DIAGNOSIS — R110 Nausea: Secondary | ICD-10-CM | POA: Diagnosis not present

## 2022-11-03 DIAGNOSIS — I10 Essential (primary) hypertension: Secondary | ICD-10-CM | POA: Diagnosis not present

## 2022-11-03 DIAGNOSIS — R1013 Epigastric pain: Secondary | ICD-10-CM | POA: Diagnosis present

## 2022-11-03 LAB — CBC WITH DIFFERENTIAL/PLATELET
Abs Immature Granulocytes: 0.01 10*3/uL (ref 0.00–0.07)
Basophils Absolute: 0.1 10*3/uL (ref 0.0–0.1)
Basophils Relative: 1 %
Eosinophils Absolute: 0.1 10*3/uL (ref 0.0–0.5)
Eosinophils Relative: 2 %
HCT: 40.3 % (ref 36.0–46.0)
Hemoglobin: 13.3 g/dL (ref 12.0–15.0)
Immature Granulocytes: 0 %
Lymphocytes Relative: 42 %
Lymphs Abs: 2.3 10*3/uL (ref 0.7–4.0)
MCH: 31.2 pg (ref 26.0–34.0)
MCHC: 33 g/dL (ref 30.0–36.0)
MCV: 94.6 fL (ref 80.0–100.0)
Monocytes Absolute: 0.4 10*3/uL (ref 0.1–1.0)
Monocytes Relative: 8 %
Neutro Abs: 2.5 10*3/uL (ref 1.7–7.7)
Neutrophils Relative %: 47 %
Platelets: 245 10*3/uL (ref 150–400)
RBC: 4.26 MIL/uL (ref 3.87–5.11)
RDW: 15.3 % (ref 11.5–15.5)
WBC: 5.4 10*3/uL (ref 4.0–10.5)
nRBC: 0 % (ref 0.0–0.2)

## 2022-11-03 LAB — URINALYSIS, ROUTINE W REFLEX MICROSCOPIC
Bilirubin Urine: NEGATIVE
Glucose, UA: NEGATIVE mg/dL
Ketones, ur: NEGATIVE mg/dL
Leukocytes,Ua: NEGATIVE
Nitrite: NEGATIVE
Protein, ur: NEGATIVE mg/dL
Specific Gravity, Urine: 1.013 (ref 1.005–1.030)
pH: 7 (ref 5.0–8.0)

## 2022-11-03 LAB — COMPREHENSIVE METABOLIC PANEL
ALT: 20 U/L (ref 0–44)
AST: 24 U/L (ref 15–41)
Albumin: 3.8 g/dL (ref 3.5–5.0)
Alkaline Phosphatase: 64 U/L (ref 38–126)
Anion gap: 5 (ref 5–15)
BUN: 20 mg/dL (ref 8–23)
CO2: 29 mmol/L (ref 22–32)
Calcium: 9.1 mg/dL (ref 8.9–10.3)
Chloride: 100 mmol/L (ref 98–111)
Creatinine, Ser: 0.96 mg/dL (ref 0.44–1.00)
GFR, Estimated: >60 mL/min (ref >60)
Glucose, Bld: 100 mg/dL — ABNORMAL HIGH (ref 70–99)
Potassium: 4.6 mmol/L (ref 3.5–5.1)
Sodium: 134 mmol/L — ABNORMAL LOW (ref 135–145)
Total Bilirubin: 0.9 mg/dL (ref 0.3–1.2)
Total Protein: 7.1 g/dL (ref 6.5–8.1)

## 2022-11-03 LAB — LIPASE, BLOOD: Lipase: 28 U/L (ref 11–51)

## 2022-11-03 LAB — TROPONIN I (HIGH SENSITIVITY)
Troponin I (High Sensitivity): 4 ng/L (ref <18)
Troponin I (High Sensitivity): 5 ng/L (ref <18)

## 2022-11-03 MED ORDER — PANTOPRAZOLE SODIUM 40 MG IV SOLR
40.0000 mg | Freq: Once | INTRAVENOUS | Status: AC
  Administered 2022-11-03: 40 mg via INTRAVENOUS
  Filled 2022-11-03: qty 10

## 2022-11-03 MED ORDER — ONDANSETRON HCL 4 MG PO TABS: 4.0000 mg | ORAL_TABLET | Freq: Four times a day (QID) | ORAL | 0 refills | Status: AC | PRN

## 2022-11-03 MED ORDER — ONDANSETRON HCL 4 MG/2ML IJ SOLN
4.0000 mg | Freq: Once | INTRAMUSCULAR | Status: AC
  Administered 2022-11-03: 4 mg via INTRAVENOUS
  Filled 2022-11-03: qty 2

## 2022-11-03 MED ORDER — PANTOPRAZOLE SODIUM 20 MG PO TBEC: 20.0000 mg | DELAYED_RELEASE_TABLET | Freq: Every day | ORAL | 0 refills | Status: DC

## 2022-11-03 MED ORDER — LACTATED RINGERS IV BOLUS
500.0000 mL | Freq: Once | INTRAVENOUS | Status: AC
  Administered 2022-11-03: 500 mL via INTRAVENOUS

## 2022-11-03 MED ORDER — IOHEXOL 300 MG/ML  SOLN
100.0000 mL | Freq: Once | INTRAMUSCULAR | Status: AC | PRN
  Administered 2022-11-03: 100 mL via INTRAVENOUS

## 2022-11-03 NOTE — Discharge Instructions (Addendum)
You were seen in the ER for evaluation of your abdominal pain and nausea.  Your CT imaging does not show any acute changes.  I likely think that you have viral gastro illness.  I am going to recommend you take Protonix once daily for the next 2 weeks. Additionally I will prescribe you some Zofran, which is anti-nausea medication you can take as needed. If you have any concerns, new or worsening symptoms, please return to the nearest ER for re-evaluation.   Contact a doctor if: Your belly pain changes or gets worse. You are not hungry, or you lose weight without trying. You are having trouble pooping (constipated) or have watery poop (diarrhea) for more than 2-3 days. You have pain when you pee or poop. Your belly pain wakes you up at night. Your pain gets worse with meals, after eating, or with certain foods. You are vomiting and cannot keep anything down. You have a fever. You have blood in your pee. Get help right away if: Your pain does not go away as soon as your doctor says it should. You cannot stop vomiting. Your pain is only in areas of your belly, such as the right side or the left lower part of the belly. You have bloody or black poop, or poop that looks like tar. You have very bad pain, cramping, or bloating in your belly. You have signs of not having enough fluid or water in your body (dehydration), such as: Dark pee, very little pee, or no pee. Cracked lips. Dry mouth. Sunken eyes. Sleepiness. Weakness. You have trouble breathing or chest pain.

## 2022-11-03 NOTE — ED Notes (Signed)
Patient transported to CT 

## 2022-11-03 NOTE — ED Provider Notes (Signed)
[place] EMERGENCY DEPARTMENT AT [facility] Provider Note   CSN: [id number] Arrival date & time: 11/03/22  1914     History Chief Complaint  Patient presents with   Abdominal Pain   Nausea    [name] is a 77 y.o. female with h/o HTN and GERD presents to the ER for evaluation of epigastric/left upper quadrant pain since yesterday.  She reports it is more of an aching sensation.  She has had some nausea and felt she was dry heaving but no actual emesis.  She reports that she has been constipated and took a laxative tonight because been having diarrhea since.  No fevers, no dysuria, hematuria, chest pain, shortness of breath.  She reports he took some Rolaids last night had some relief.  She reports that this pain started after she was at the bedside with her sister.  She is 1/2 pack/day smoker but denies any EtOH or illicit drug use.   Abdominal Pain Associated symptoms: constipation, diarrhea and nausea   Associated symptoms: no chest pain, no chills, no dysuria, no fever, no hematuria, no shortness of breath and no vomiting        Home Medications Prior to Admission medications   Medication Sig Start Date End Date Taking? Authorizing Provider  acyclovir (ZOVIRAX) 400 MG tablet Take 1 tablet (400 mg total) by mouth 4 (four) times daily. 04/10/14   Zadie Rhine, MD  aspirin EC 81 MG tablet Take 81 mg by mouth every morning.     [provider]  Cholecalciferol (VITAMIN D PO) Take 1 capsule by mouth every morning.    [provider]  HYDROcodone-acetaminophen (NORCO/VICODIN) 5-325 MG per tablet Take 1 tablet by mouth every 4 (four) hours as needed for moderate pain. 02/27/15   Gilda Crease, MD  naproxen (NAPROSYN) 500 MG tablet Take 1 tablet (500 mg total) by mouth 2 (two) times daily. 12/27/15   Bethann Berkshire, MD  oxyCODONE-acetaminophen (PERCOCET/ROXICET) 5-325 MG per tablet Take 1 tablet by mouth every 4 (four) hours as needed for  severe pain. 04/10/14   Zadie Rhine, MD  UNKNOWN TO PATIENT Take 1 tablet by mouth every morning. BP/FLUID COMBO    [provider]  UNKNOWN TO PATIENT Take 1 tablet by mouth every morning. FLUID MEDICATION    [provider]      Allergies    Aspirin    Review of Systems   Review of Systems  Constitutional:  Negative for chills and fever.  Respiratory:  Negative for shortness of breath.   Cardiovascular:  Negative for chest pain.  Gastrointestinal:  Positive for abdominal pain, constipation, diarrhea and nausea. Negative for blood in stool and vomiting.  Genitourinary:  Negative for dysuria and hematuria.    Physical Exam Updated Vital Signs BP (!) 113/92   Pulse 83   Temp 97.9 F (36.6 C) (Oral)   Resp 17   SpO2 98%  Physical Exam Vitals and nursing note reviewed.  Constitutional:      General: She is not in acute distress.    Appearance: Normal appearance. She is not toxic-appearing.  HENT:     Head: Normocephalic and atraumatic.     Mouth/Throat:     Mouth: Mucous membranes are moist.  Eyes:     General: No scleral icterus. Cardiovascular:     Rate and Rhythm: Normal rate and regular rhythm.  Pulmonary:     Effort: Pulmonary effort is normal.     Breath sounds: Normal  breath sounds.  Abdominal:     General: Bowel sounds are normal.     Palpations: Abdomen is soft.     Tenderness: There is abdominal tenderness in the epigastric area and left upper quadrant. There is no guarding or rebound.  Musculoskeletal:        General: No deformity.     Cervical back: Normal range of motion.  Skin:    General: Skin is warm and dry.  Neurological:     General: No focal deficit present.     Mental Status: She is alert. Mental status is at baseline.     ED Results / Procedures / Treatments   Labs (all labs ordered are listed, but only abnormal results are displayed) Labs Reviewed  COMPREHENSIVE METABOLIC PANEL - Abnormal; Notable for the following  components:      Result Value   Sodium 134 (*)    Glucose, Bld 100 (*)    All other components within normal limits  URINALYSIS, ROUTINE W REFLEX MICROSCOPIC - Abnormal; Notable for the following components:   Color, Urine STRAW (*)    Hgb urine dipstick SMALL (*)    Bacteria, UA RARE (*)    All other components within normal limits  LIPASE, BLOOD  CBC WITH DIFFERENTIAL/PLATELET  TROPONIN I (HIGH SENSITIVITY)  TROPONIN I (HIGH SENSITIVITY)    EKG EKG Interpretation  Date/Time:  Tuesday November 03 2022 10:51:41 EDT Ventricular Rate:  74 PR Interval:  317 QRS Duration: 83 QT Interval:  420 QTC Calculation: 466 R Axis:   5 Text Interpretation: Sinus rhythm Prolonged PR interval Consider right atrial enlargement Low voltage, precordial leads Confirmed by Tilden Fossa [phone]) on 11/04/2022 1:01:47 PM  Radiology CT ABDOMEN PELVIS W CONTRAST  Result Date: 11/03/2022 CLINICAL DATA:  Upper abdominal pain and nausea for the past day. EXAM: CT ABDOMEN AND PELVIS WITH CONTRAST TECHNIQUE: Multidetector CT imaging of the abdomen and pelvis was performed using the standard protocol following bolus administration of intravenous contrast. RADIATION DOSE REDUCTION: This exam was performed according to the departmental dose-optimization program which includes automated exposure control, adjustment of the mA and/or kV according to patient size and/or use of iterative reconstruction technique. CONTRAST:  OMNIPAQUE IOHEXOL 300 MG/ML  SOLN COMPARISON:  None Available. FINDINGS: Lower chest: No acute abnormality. Hepatobiliary: No focal liver abnormality is seen. No gallstones, gallbladder wall thickening, or biliary dilatation. Pancreas: Unremarkable. No pancreatic ductal dilatation or surrounding inflammatory changes. Spleen: Normal in size without focal abnormality. Adrenals/Urinary Tract: Mild left adrenal gland thickening without discrete nodule. Right adrenal gland is unremarkable. No renal mass,  calculi, or hydronephrosis. Bladder is unremarkable. Stomach/Bowel: Small hiatal hernia. The stomach is otherwise within normal limits. No bowel wall thickening, distention, or surrounding inflammatory changes. Mild sigmoid colonic diverticulosis. Diminutive or absent appendix. Vascular/Lymphatic: Aortic atherosclerosis. No enlarged abdominal or pelvic lymph nodes. Reproductive: Status post hysterectomy. No adnexal masses. Other: No abdominal wall hernia or abnormality. No abdominopelvic ascites. No pneumoperitoneum. Musculoskeletal: No acute or significant osseous findings. IMPRESSION: 1. No acute intra-abdominal process. 2.  Aortic Atherosclerosis (ICD10-I70.0). Electronically Signed   By: Obie Dredge M.D.   On: 11/03/2022 11:59   DG Chest 2 View  Result Date: 11/03/2022 CLINICAL DATA:  Left upper quadrant abdominal pain with nausea and vomiting for 1 day. EXAM: CHEST - 2 VIEW COMPARISON:  12/04/2013. FINDINGS: Cardiac silhouette is normal in size and configuration. Normal mediastinal and hilar contours. Clear lungs.  No pleural effusion or pneumothorax. Skeletal structures are intact. IMPRESSION:  No active cardiopulmonary disease. Electronically Signed   By: Amie Portland M.D.   On: 11/03/2022 10:35    Procedures Procedures   Medications Ordered in ED Medications  pantoprazole (PROTONIX) injection 40 mg (40 mg Intravenous Given 11/03/22 1059)  ondansetron (ZOFRAN) injection 4 mg (4 mg Intravenous Given 11/03/22 1057)  lactated ringers bolus 500 mL (0 mLs Intravenous Stopped 11/03/22 1215)  iohexol (OMNIPAQUE) 300 MG/ML solution 100 mL (100 mLs Intravenous Contrast Given 11/03/22 1149)    ED Course/ Medical Decision Making/ A&P                            Medical Decision Making Amount and/or Complexity of Data Reviewed Labs: ordered. Radiology: ordered.  Risk Prescription drug management.   77 y.o. female presents to the ER for evaluation of epigastric abdominal pain and nausea.  Differential diagnosis includes but is not limited to PUD, GERD, gastritis, pancreatitis, gastroparesis, malignancy, biliary disease, ACS, pericarditis, pneumonia, intestinal ischemia, esophageal rupture, hepatitis. Vital signs unremarkable. Physical exam as noted above.   CT imaging, CXR, and labs ordered. Additionally, ordered her some IV fluids, Zofran, and Protonix.   I independently reviewed and interpreted the patient's labs.  Urinalysis shows straw-colored urine with small and hemoglobin rare bacteria otherwise normal.  CBC without leukocytosis or anemia.  CMP shows mildly decreased sodium 134, mildly increased glucose at 100.  Otherwise no electrolyte or LFT abnormality.  Lipase within normal limits.  Initial troponin at 4 with repeat at 5.  EKG reviewed interpreted by an attending and read as Sinus rhythm Prolonged PR interval Consider right atrial enlargement Low voltage, precordial leads .  CXR shows No active cardiopulmonary disease.   CT imaging shows 1. No acute intra-abdominal process. 2.  Aortic Atherosclerosis (ICD10-I70.0).  On re-evaluation, the patient reports that she is feeling much better. She has been able to eat crackers and drink water without any emesis, pain, or nausea.   After consideration of the diagnostic results and the patients response to treatment, I feel that the patient can be discharged home. She likely has some gastritis or gastroenteritis from eating out. She feels better after the medication and she has been able to tolerate PO. There is no significant lab derangement and her CT shows nothing acute. EKG and troponins do not suggest ACS. CXR unremarkable.   We discussed the results of the labs/imaging. The plan is bland diet, supportive care, take medication as prescribed, follow up with PCP. We discussed strict return precautions and red flag symptoms. The patient verbalized their understanding and agrees to the plan. The patient is stable and being  discharged home in good condition.  Portions of this report may have been transcribed using voice recognition software. Every effort was made to ensure accuracy; however, inadvertent computerized transcription errors may be present.   Final Clinical Impression(s) / ED Diagnoses Final diagnoses:  Epigastric pain  Nausea    Rx / DC Orders ED Discharge Orders          Ordered    pantoprazole (PROTONIX) 20 MG tablet  Daily        11/03/22 1603    ondansetron (ZOFRAN) 4 MG tablet  Every 6 hours PRN        11/03/22 1603              Achille Rich, PA-C 11/06/22 1120    Tanda Rockers A, DO 11/12/22 1534

## 2022-11-03 NOTE — ED Triage Notes (Signed)
LUQ pain with nausea and dry heaving x 1 day. Ambulatory with steady gait. Took laxative 2 days ago and has had small bm since. Denies diarrhea. Pain  and nausea worse with eating per pt. Color wnl. Non diaphoretic. Guarding area noted during triage with some moaning. Denies changes in gu.

## 2023-01-08 IMAGING — DX DG KNEE COMPLETE 4+V*L*
4 series · 4 of 4 positions shown · non-contrast
Comparison: None.

CLINICAL DATA: Left knee pain.  Fall in the kitchen while cooking.

EXAM:
LEFT KNEE - COMPLETE 4+ VIEW

[knee ap]
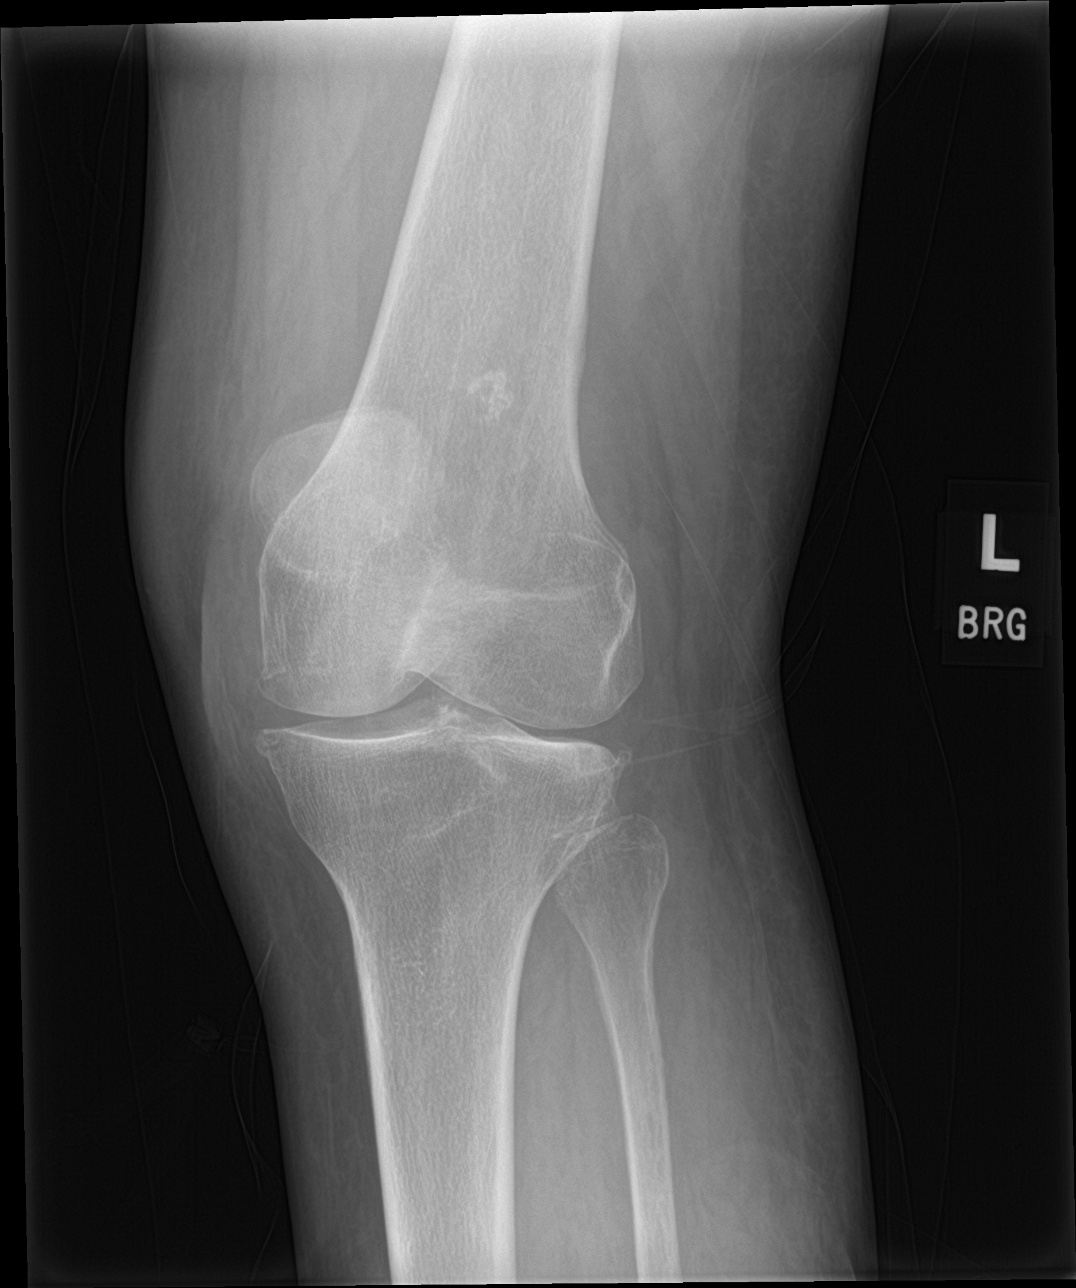

[knee obl (1 of 2)]
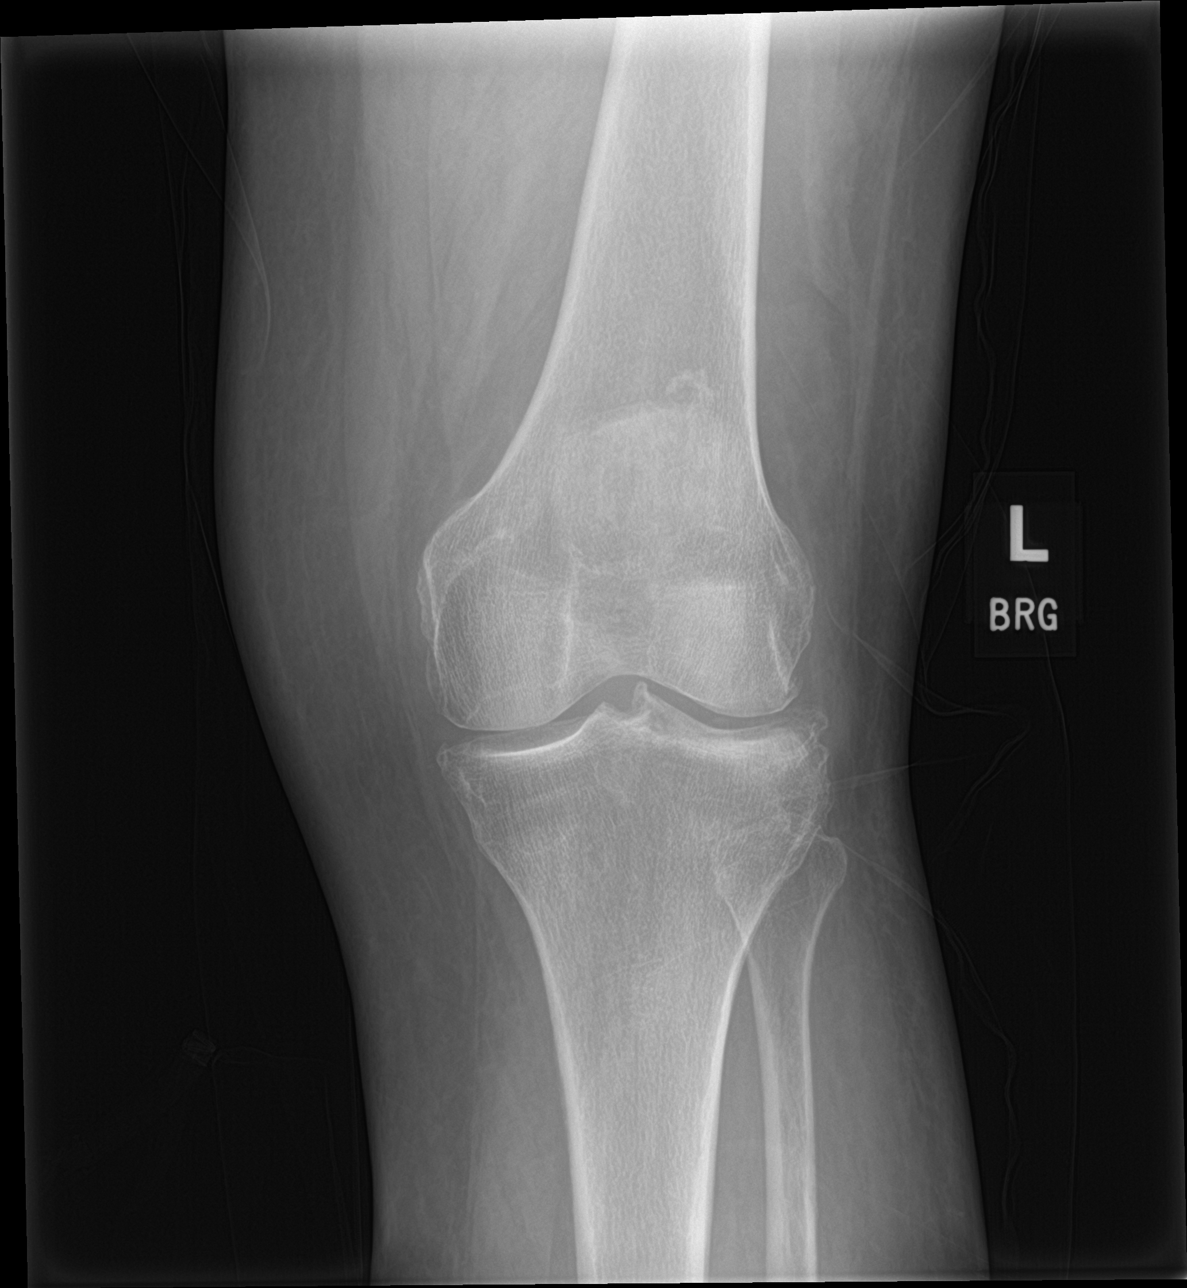

[knee obl (2 of 2)]
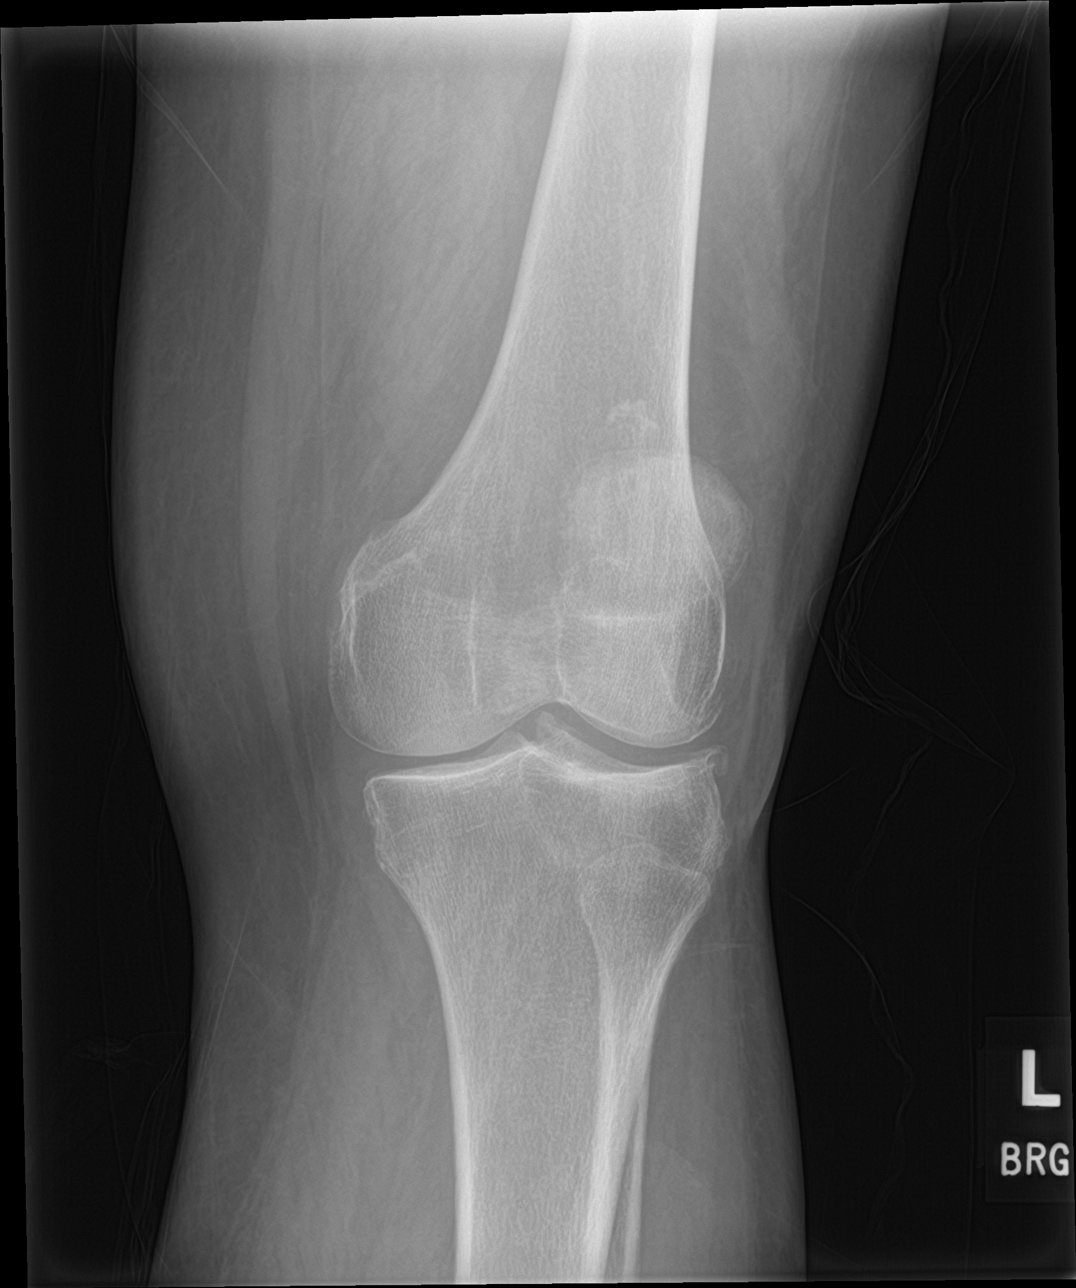

[knee lat]
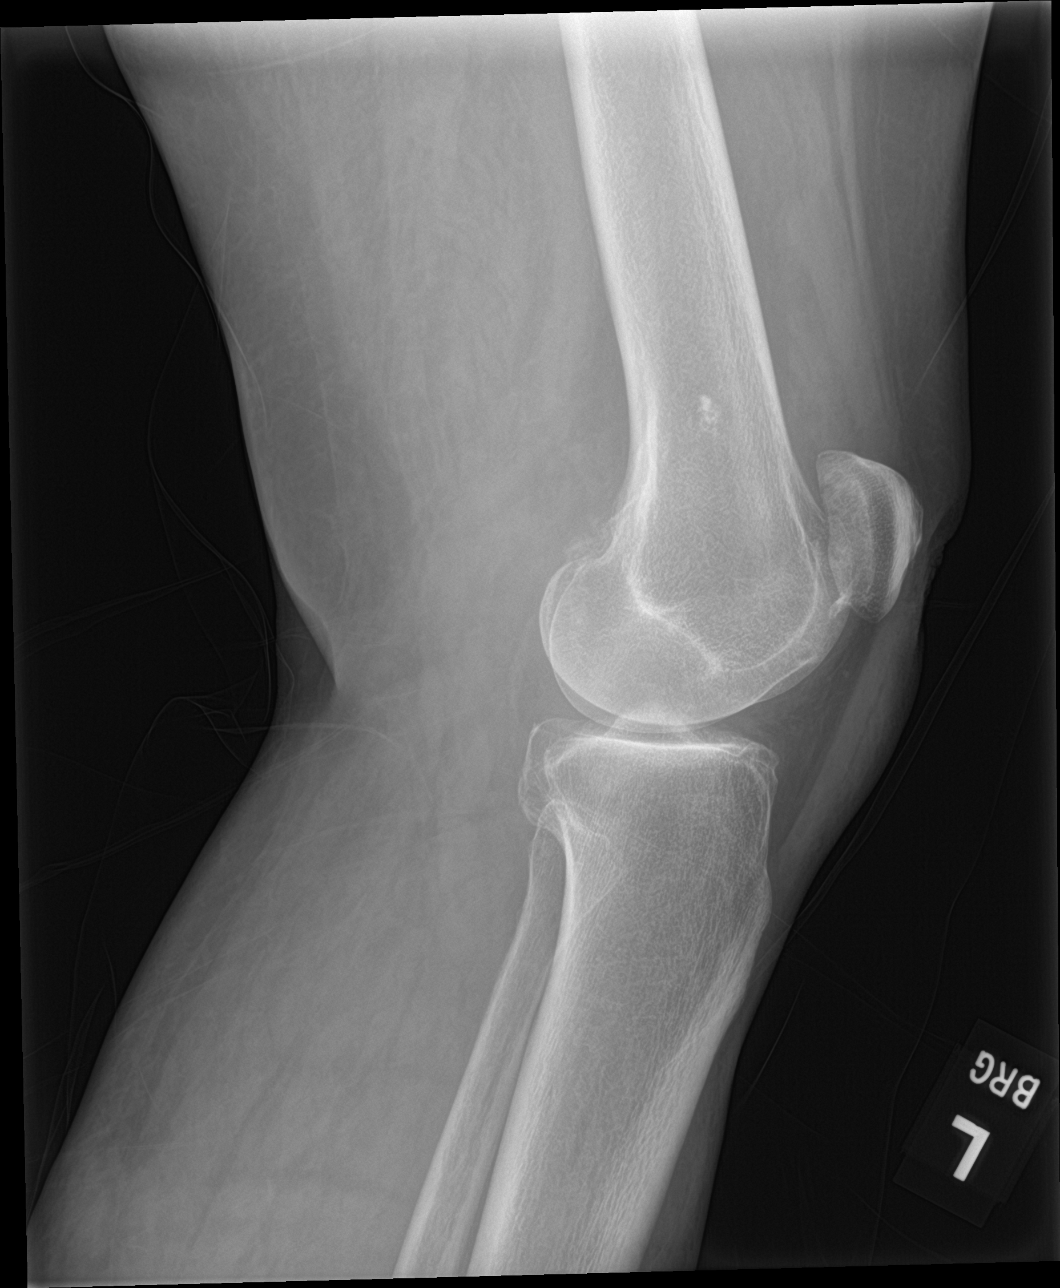

[4 of 4 positions shown; findings below may reference images not displayed]

FINDINGS: No fracture or dislocation. There is tricompartmental osteoarthritis
with peripheral spurring and spurring of the tibial spines. More
medial and lateral tibiofemoral joint space narrowing no significant
knee joint effusion. Mild anterior soft tissue edema.
IMPRESSION: Tricompartmental osteoarthritis without acute fracture or
dislocation.

## 2023-05-31 ENCOUNTER — Other Ambulatory Visit: Payer: Self-pay

## 2023-05-31 ENCOUNTER — Emergency Department (HOSPITAL_COMMUNITY): Payer: Medicare HMO

## 2023-05-31 ENCOUNTER — Encounter (HOSPITAL_COMMUNITY): Payer: Self-pay | Admitting: *Deleted

## 2023-05-31 ENCOUNTER — Emergency Department (HOSPITAL_COMMUNITY)
Admission: EM | Admit: 2023-05-31 | Discharge: 2023-05-31 | Disposition: A | Discharge status: Home / Self Care | Payer: Medicare HMO | Attending: Emergency Medicine | Admitting: Emergency Medicine

## 2023-05-31 DIAGNOSIS — R918 Other nonspecific abnormal finding of lung field: Secondary | ICD-10-CM | POA: Diagnosis not present

## 2023-05-31 DIAGNOSIS — R0989 Other specified symptoms and signs involving the circulatory and respiratory systems: Secondary | ICD-10-CM | POA: Diagnosis not present

## 2023-05-31 DIAGNOSIS — J101 Influenza due to other identified influenza virus with other respiratory manifestations: Secondary | ICD-10-CM | POA: Diagnosis not present

## 2023-05-31 DIAGNOSIS — D696 Thrombocytopenia, unspecified: Secondary | ICD-10-CM | POA: Diagnosis not present

## 2023-05-31 DIAGNOSIS — J441 Chronic obstructive pulmonary disease with (acute) exacerbation: Secondary | ICD-10-CM | POA: Diagnosis not present

## 2023-05-31 DIAGNOSIS — J189 Pneumonia, unspecified organism: Secondary | ICD-10-CM | POA: Insufficient documentation

## 2023-05-31 DIAGNOSIS — Z1152 Encounter for screening for COVID-19: Secondary | ICD-10-CM | POA: Insufficient documentation

## 2023-05-31 DIAGNOSIS — R000 Tachycardia, unspecified: Secondary | ICD-10-CM | POA: Insufficient documentation

## 2023-05-31 DIAGNOSIS — R059 Cough, unspecified: Secondary | ICD-10-CM | POA: Diagnosis not present

## 2023-05-31 DIAGNOSIS — Z720 Tobacco use: Secondary | ICD-10-CM | POA: Diagnosis present

## 2023-05-31 DIAGNOSIS — K219 Gastro-esophageal reflux disease without esophagitis: Secondary | ICD-10-CM | POA: Diagnosis present

## 2023-05-31 DIAGNOSIS — J09X1 Influenza due to identified novel influenza A virus with pneumonia: Secondary | ICD-10-CM | POA: Diagnosis not present

## 2023-05-31 DIAGNOSIS — J449 Chronic obstructive pulmonary disease, unspecified: Secondary | ICD-10-CM | POA: Diagnosis present

## 2023-05-31 DIAGNOSIS — R509 Fever, unspecified: Secondary | ICD-10-CM | POA: Diagnosis not present

## 2023-05-31 LAB — RESP PANEL BY RT-PCR (RSV, FLU A&B, COVID)  RVPGX2
Influenza A by PCR: POSITIVE — AB
Influenza B by PCR: NEGATIVE
Resp Syncytial Virus by PCR: NEGATIVE
SARS Coronavirus 2 by RT PCR: NEGATIVE

## 2023-05-31 LAB — COMPREHENSIVE METABOLIC PANEL
ALT: 25 U/L (ref 0–44)
AST: 35 U/L (ref 15–41)
Albumin: 4 g/dL (ref 3.5–5.0)
Alkaline Phosphatase: 80 U/L (ref 38–126)
Anion gap: 12 (ref 5–15)
BUN: 15 mg/dL (ref 8–23)
CO2: 25 mmol/L (ref 22–32)
Calcium: 9.1 mg/dL (ref 8.9–10.3)
Chloride: 98 mmol/L (ref 98–111)
Creatinine, Ser: 1.04 mg/dL — ABNORMAL HIGH (ref 0.44–1.00)
GFR, Estimated: 55 mL/min — ABNORMAL LOW (ref >60)
Glucose, Bld: 110 mg/dL — ABNORMAL HIGH (ref 70–99)
Potassium: 3.5 mmol/L (ref 3.5–5.1)
Sodium: 135 mmol/L (ref 135–145)
Total Bilirubin: 1 mg/dL (ref <1.2)
Total Protein: 7.6 g/dL (ref 6.5–8.1)

## 2023-05-31 LAB — CBC WITH DIFFERENTIAL/PLATELET
Abs Immature Granulocytes: 0.03 10*3/uL (ref 0.00–0.07)
Basophils Absolute: 0 10*3/uL (ref 0.0–0.1)
Basophils Relative: 1 %
Eosinophils Absolute: 0 10*3/uL (ref 0.0–0.5)
Eosinophils Relative: 0 %
HCT: 46 % (ref 36.0–46.0)
Hemoglobin: 14.9 g/dL (ref 12.0–15.0)
Immature Granulocytes: 1 %
Lymphocytes Relative: 10 %
Lymphs Abs: 0.6 10*3/uL — ABNORMAL LOW (ref 0.7–4.0)
MCH: 30.8 pg (ref 26.0–34.0)
MCHC: 32.4 g/dL (ref 30.0–36.0)
MCV: 95 fL (ref 80.0–100.0)
Monocytes Absolute: 0.4 10*3/uL (ref 0.1–1.0)
Monocytes Relative: 6 %
Neutro Abs: 5.2 10*3/uL (ref 1.7–7.7)
Neutrophils Relative %: 82 %
Platelets: 149 10*3/uL — ABNORMAL LOW (ref 150–400)
RBC: 4.84 MIL/uL (ref 3.87–5.11)
RDW: 15 % (ref 11.5–15.5)
WBC: 6.2 10*3/uL (ref 4.0–10.5)
nRBC: 0 % (ref 0.0–0.2)

## 2023-05-31 LAB — LACTIC ACID, PLASMA
Lactic Acid, Venous: 1.4 mmol/L (ref 0.5–1.9)
Lactic Acid, Venous: 2 mmol/L (ref 0.5–1.9)
Lactic Acid, Venous: 1.8 mmol/L (ref 0.5–1.9)

## 2023-05-31 MED ORDER — METHYLPREDNISOLONE SODIUM SUCC 125 MG IJ SOLR
125.0000 mg | Freq: Once | INTRAMUSCULAR | Status: AC
  Administered 2023-05-31: 125 mg via INTRAVENOUS
  Filled 2023-05-31: qty 2

## 2023-05-31 MED ORDER — LACTATED RINGERS IV BOLUS
1000.0000 mL | Freq: Once | INTRAVENOUS | Status: AC
  Administered 2023-05-31: 1000 mL via INTRAVENOUS

## 2023-05-31 MED ORDER — PANTOPRAZOLE SODIUM 20 MG PO TBEC: 20.0000 mg | DELAYED_RELEASE_TABLET | Freq: Every day | ORAL | 3 refills | Status: DC

## 2023-05-31 MED ORDER — KETOROLAC TROMETHAMINE 15 MG/ML IJ SOLN
15.0000 mg | Freq: Once | INTRAMUSCULAR | Status: AC
  Administered 2023-05-31: 15 mg via INTRAVENOUS
  Filled 2023-05-31: qty 1

## 2023-05-31 MED ORDER — OSELTAMIVIR PHOSPHATE 75 MG PO CAPS: 75.0000 mg | ORAL_CAPSULE | Freq: Two times a day (BID) | ORAL | 0 refills | Status: AC

## 2023-05-31 MED ORDER — LACTATED RINGERS IV BOLUS (SEPSIS)
1000.0000 mL | Freq: Once | INTRAVENOUS | Status: AC
  Administered 2023-05-31: 1000 mL via INTRAVENOUS

## 2023-05-31 MED ORDER — PREDNISONE 20 MG PO TABS: 40.0000 mg | ORAL_TABLET | Freq: Every day | ORAL | 0 refills | Status: AC

## 2023-05-31 MED ORDER — ALBUTEROL SULFATE (2.5 MG/3ML) 0.083% IN NEBU
2.5000 mg | INHALATION_SOLUTION | Freq: Once | RESPIRATORY_TRACT | Status: AC
  Administered 2023-05-31: 2.5 mg via RESPIRATORY_TRACT
  Filled 2023-05-31: qty 3

## 2023-05-31 MED ORDER — OSELTAMIVIR PHOSPHATE 75 MG PO CAPS
75.0000 mg | ORAL_CAPSULE | Freq: Once | ORAL | Status: AC
  Administered 2023-05-31: 75 mg via ORAL
  Filled 2023-05-31: qty 1

## 2023-05-31 MED ORDER — ACETAMINOPHEN 325 MG PO TABS: 650.0000 mg | ORAL_TABLET | Freq: Four times a day (QID) | ORAL | 0 refills | Status: AC | PRN

## 2023-05-31 MED ORDER — ACETAMINOPHEN 500 MG PO TABS
1000.0000 mg | ORAL_TABLET | Freq: Once | ORAL | Status: AC
  Administered 2023-05-31: 1000 mg via ORAL
  Filled 2023-05-31: qty 2

## 2023-05-31 MED ORDER — SODIUM CHLORIDE 0.9 % IV BOLUS
1000.0000 mL | Freq: Once | INTRAVENOUS | Status: AC
  Administered 2023-05-31: 1000 mL via INTRAVENOUS

## 2023-05-31 MED ORDER — DEXTROSE 5 % IV SOLN
500.0000 mg | INTRAVENOUS | Status: DC
Start: 2023-05-31 — End: 2023-06-01
  Administered 2023-05-31: 500 mg via INTRAVENOUS
  Filled 2023-05-31: qty 5

## 2023-05-31 MED ORDER — ALBUTEROL SULFATE HFA 108 (90 BASE) MCG/ACT IN AERS
2.0000 | INHALATION_SPRAY | Freq: Four times a day (QID) | RESPIRATORY_TRACT | 2 refills | Status: AC | PRN
Start: 2023-05-31 — End: ?

## 2023-05-31 MED ORDER — ATORVASTATIN CALCIUM 20 MG PO TABS: 20.0000 mg | ORAL_TABLET | Freq: Every day | ORAL | 11 refills | Status: AC

## 2023-05-31 MED ORDER — PANTOPRAZOLE SODIUM 40 MG PO TBEC: 40.0000 mg | DELAYED_RELEASE_TABLET | Freq: Every day | ORAL | 1 refills | Status: AC

## 2023-05-31 MED ORDER — DOXYCYCLINE HYCLATE 100 MG PO TABS: 100.0000 mg | ORAL_TABLET | Freq: Two times a day (BID) | ORAL | 0 refills | Status: AC

## 2023-05-31 MED ORDER — SODIUM CHLORIDE 0.9 % IV SOLN
2.0000 g | INTRAVENOUS | Status: DC
  Administered 2023-05-31: 2 g via INTRAVENOUS
  Filled 2023-05-31: qty 20

## 2023-05-31 MED ORDER — GUAIFENESIN ER 600 MG PO TB12: 600.0000 mg | ORAL_TABLET | Freq: Two times a day (BID) | ORAL | 0 refills | Status: AC

## 2023-05-31 MED ORDER — DM-GUAIFENESIN ER 30-600 MG PO TB12
1.0000 | ORAL_TABLET | Freq: Once | ORAL | Status: AC
  Administered 2023-05-31: 1 via ORAL
  Filled 2023-05-31: qty 1

## 2023-05-31 MED ORDER — IPRATROPIUM-ALBUTEROL 0.5-2.5 (3) MG/3ML IN SOLN
3.0000 mL | Freq: Once | RESPIRATORY_TRACT | Status: AC
  Administered 2023-05-31: 3 mL via RESPIRATORY_TRACT
  Filled 2023-05-31: qty 3

## 2023-05-31 MED ORDER — SODIUM CHLORIDE 0.9% FLUSH
10.0000 mL | Freq: Two times a day (BID) | INTRAVENOUS | Status: DC
  Administered 2023-05-31: 10 mL via INTRAVENOUS

## 2023-05-31 NOTE — ED Notes (Signed)
Pt eating sandwich and drinking ginerale.

## 2023-05-31 NOTE — ED Notes (Signed)
Please call Lenice Pressman [phone] ([email or web address]) with updates about the patient

## 2023-05-31 NOTE — Sepsis Progress Note (Signed)
Sepsis protocol monitored by eLink ?

## 2023-05-31 NOTE — ED Provider Notes (Signed)
[place] EMERGENCY DEPARTMENT AT [facility] Provider Note   CSN: [id number] Arrival date & time: 05/31/23  0909     History {Add pertinent medical, surgical, social history, OB history to HPI:1} Chief Complaint  Patient presents with   Cough    [name] is a 77 y.o. female.  She has PMH of CVA.  Presents to ER today for 2-day history of chills, body aches, fever, cough.  Denies any sick contacts.  No nausea or vomiting.   Cough      Home Medications Prior to Admission medications   Medication Sig Start Date End Date Taking? Authorizing Provider  acyclovir (ZOVIRAX) 400 MG tablet Take 1 tablet (400 mg total) by mouth 4 (four) times daily. 04/10/14   Zadie Rhine, MD  aspirin EC 81 MG tablet Take 81 mg by mouth every morning.     [provider]  Cholecalciferol (VITAMIN D PO) Take 1 capsule by mouth every morning.    [provider]  HYDROcodone-acetaminophen (NORCO/VICODIN) 5-325 MG per tablet Take 1 tablet by mouth every 4 (four) hours as needed for moderate pain. 02/27/15   Gilda Crease, MD  naproxen (NAPROSYN) 500 MG tablet Take 1 tablet (500 mg total) by mouth 2 (two) times daily. 12/27/15   Bethann Berkshire, MD  ondansetron (ZOFRAN) 4 MG tablet Take 1 tablet (4 mg total) by mouth every 6 (six) hours as needed for nausea or vomiting. 11/03/22   Achille Rich, PA-C  oxyCODONE-acetaminophen (PERCOCET/ROXICET) 5-325 MG per tablet Take 1 tablet by mouth every 4 (four) hours as needed for severe pain. 04/10/14   Zadie Rhine, MD  pantoprazole (PROTONIX) 20 MG tablet Take 1 tablet (20 mg total) by mouth daily. 11/03/22   Achille Rich, PA-C  UNKNOWN TO PATIENT Take 1 tablet by mouth every morning. BP/FLUID COMBO    [provider]  UNKNOWN TO PATIENT Take 1 tablet by mouth every morning. FLUID MEDICATION    [provider]      Allergies    Aspirin    Review of Systems   Review of Systems  Respiratory:  Positive  for cough.     Physical Exam Updated Vital Signs BP (!) 159/85   Pulse (!) 122   Temp (!) 102 F (38.9 C) (Oral)   Resp 20   Ht 5\' 4"  (1.626 m)   Wt 77.1 kg   SpO2 98%   BMI 29.18 kg/m  Physical Exam Vitals and nursing note reviewed.  Constitutional:      General: She is not in acute distress.    Appearance: She is well-developed.  HENT:     Head: Normocephalic and atraumatic.     Nose: Nose normal.     Mouth/Throat:     Mouth: Mucous membranes are moist.  Eyes:     Extraocular Movements: Extraocular movements intact.     Conjunctiva/sclera: Conjunctivae normal.     Pupils: Pupils are equal, round, and reactive to light.  Cardiovascular:     Rate and Rhythm: Regular rhythm. Tachycardia present.     Heart sounds: No murmur heard. Pulmonary:     Effort: Pulmonary effort is normal. No respiratory distress.     Breath sounds: Normal breath sounds.  Abdominal:     Palpations: Abdomen is soft.     Tenderness: There is no abdominal tenderness.  Musculoskeletal:        General: No swelling. Normal range of motion.     Cervical back: Neck supple.  Skin:    General: Skin is warm and dry.     Capillary Refill: Capillary refill takes less than 2 seconds.  Neurological:     General: No focal deficit present.     Mental Status: She is alert and oriented to person, place, and time.  Psychiatric:        Mood and Affect: Mood normal.     ED Results / Procedures / Treatments   Labs (all labs ordered are listed, but only abnormal results are displayed) Labs Reviewed  RESP PANEL BY RT-PCR (RSV, FLU A&B, COVID)  [id number]  CULTURE, BLOOD (ROUTINE X 2)  CULTURE, BLOOD (ROUTINE X 2)  COMPREHENSIVE METABOLIC PANEL  CBC WITH DIFFERENTIAL/PLATELET  LACTIC ACID, PLASMA  LACTIC ACID, PLASMA    EKG None  Radiology No results found.  Procedures Procedures  {Document cardiac monitor, telemetry assessment procedure when appropriate:1}  Medications Ordered in ED Medications   acetaminophen (TYLENOL) tablet 1,000 mg (has no administration in time range)    ED Course/ Medical Decision Making/ A&P Clinical Course as of 05/31/23 1350  Mon May 31, 2023  1347 Patient presented with fever, tachycardia, cough and tachypnea, given IV fluids, she had some rales on her left lower lobe given symptoms treated empirically for pneumonia given her positive sepsis criteria.  Chest x-ray did show an infiltrate but she is also positive for flu a which would explain some of the fever and tachycardia as well.  Could be a viral pneumonia.  After IV fluids and fever control she still tachycardic, having increased work of breathing, though not hypoxic she is having some trouble breathing.  I consult hospitalist for possible admission at this time.  Dr. Mariea Clonts will come evaluate the patient and decide if he is going to admit her. [CB]    Clinical Course User Index [CB] Carmel Sacramento A, PA-C   {   Click here for ABCD2, HEART and other calculatorsREFRESH Note before signing :1}                              Medical Decision Making This patient presents to the ED for concern of cough, chills, fever, this involves an extensive number of treatment options, and is a complaint that carries with it a high risk of complications and morbidity.  The differential diagnosis includes pneumonia, influenza, COVID-19, sepsis, pulmonary embolism, CHF, other   Co morbidities that complicate the patient evaluation :   CVA, hypertension   Additional history obtained:  Additional history obtained from EMR External records from outside source obtained and reviewed including notes   Lab Tests:  I Ordered, and personally interpreted labs.  The pertinent results include: CBC shows mild normocytic anemia, no leukocytosis, CMP shows normal electrolytes, slightly decreased GFR at 55 from baseline of greater than 60, positive influenza A   Imaging Studies ordered:  I ordered imaging studies including  chest x-ray which shows left retrocardiac opacity I independently visualized and interpreted imaging within scope of identifying emergent findings  I agree with the radiologist interpretation   Cardiac Monitoring: / EKG:  The patient was maintained on a cardiac monitor.  I personally viewed and interpreted the cardiac monitored which showed an underlying rhythm of: sinus tachycardia   Consultations Obtained:  I requested consultation with the hospitalist,  and discussed lab and imaging findings as well as pertinent plan - they recommend: he will see patient in consult   Problem List / ED  Course / Critical interventions / Medication management  *** I ordered medication including ***  for ***  Reevaluation of the patient after these medicines showed that the patient {resolved/improved/worsened:23923::"improved"} I have reviewed the patients home medicines and have made adjustments as needed   Social Determinants of Health:  ***   Test / Admission - Considered:  ***    Amount and/or Complexity of Data Reviewed Labs: ordered. Radiology: ordered.  Risk OTC drugs. Prescription drug management.   ***  {Document critical care time when appropriate:1} {Document review of labs and clinical decision tools ie heart score, Chads2Vasc2 etc:1}  {Document your independent review of radiology images, and any outside records:1} {Document your discussion with family members, caretakers, and with consultants:1} {Document social determinants of health affecting pt's care:1} {Document your decision making why or why not admission, treatments were needed:1} Final Clinical Impression(s) / ED Diagnoses Final diagnoses:  None    Rx / DC Orders ED Discharge Orders     None

## 2023-05-31 NOTE — ED Triage Notes (Signed)
Pt c/o cough and tightness in her chest that started yesterday  Pt c/o chills

## 2023-05-31 NOTE — ED Notes (Signed)
Pt assisted with changing brief before discharge.

## 2023-05-31 NOTE — Consult Note (Signed)
CODE SEPSIS - PHARMACY COMMUNICATION  **Broad-spectrum antimicrobials should be administered within one hour of sepsis diagnosis**  Time Code Sepsis call or page was received: 1117  Antibiotics ordered: Ceftriaxone, Azithromycin  Time of first antibiotic administration: 1127  Additional action taken by pharmacy: N/A  If necessary, name of provider/nurse contacted: N/A    Will M. Dareen Piano, PharmD Clinical Pharmacist 05/31/2023 11:49 AM

## 2023-05-31 NOTE — ED Provider Triage Note (Signed)
Emergency Medicine Provider Triage Evaluation Note  [name] , a 77 y.o. female  was evaluated in triage.  Pt complains of chills, cough, shortness of breath started 2 days ago.  Took cough syrup around 630 this morning without relief..  Review of Systems  Positive: Fever, chills, cough chest pain Negative: Vomiting   Physical Exam  BP (!) 159/85   Pulse (!) 122   Temp (!) 102 F (38.9 C) (Oral)   Resp 20   Ht 5\' 4"  (1.626 m)   Wt 77.1 kg   SpO2 98%   BMI 29.18 kg/m  Gen:   Awake, no distress   Resp:  Normal effort  MSK:   Moves extremities without difficulty  Other:    Medical Decision Making  Medically screening exam initiated at 10:25 AM.  Appropriate orders placed.  [name] was informed that the remainder of the evaluation will be completed by another provider, this initial triage assessment does not replace that evaluation, and the importance of remaining in the ED until their evaluation is complete.     Carmel Sacramento A, PA-C 05/31/23 1026

## 2023-05-31 NOTE — Consult Note (Addendum)
Initial Consultation Note 05/31/2023 5:38 PM                                                                                                                                                                             Patient Demographics:    [name], is a 77 y.o. female  MRN: [id number]   DOB - 12-15-1945  Admit Date - 05/31/2023  Outpatient Primary MD for the patient is Patient, No Pcp Per   Assessment & Plan:   Assessment and Plan:  Problem  Influenza A With Pneumonia  Tobacco Abuse  Copd (Chronic Obstructive Pulmonary Disease) (Hcc)  Gerd (Gastroesophageal Reflux Disease)    1) influenza A +ve ---patient is Not vaccinated against influenza -Symptoms are less than 36 hours -No hypoxia -Supportive and symptomatic treatment for fevers and aches -Drink plenty fluids----avoid dehydration -Tamiflu as ordered -Albuterol inhaler as ordered -WBC 6.2 -patient met SIRS criteria, sepsis ruled out -Lactic acid elevation most likely due to hypoperfusion and dehydration--rather than sepsis related -Lactic acid trended back down with hydration  2) acute COPD exacerbation--- going to #1 above -Empiric treatment with doxycycline, prednisone and albuterol -Mucinex prescribed -No hypoxia  3)Tobacco abuse--- smoking cessation strongly encouraged -ok to use over-the-counter nicotine patch  4)H/o Prior CVA--- aspirin and Lipitor for secondary stroke prevention advised -Patient admits to noncompliance with aspirin PTA  5)GERD--continue omeprazole especially while on steroids  6) abnormal chest x-ray--- chest x-ray with possible left-sided atelectasis versus pneumonia -Suspect this is due to influenza as above #1 -Bacterial pneumonia deemed less likely --WBC 6.2  Disposition--- Discharge home -Discharge instructions:--- 1)Drink plenty fluids and avoid dehydration 2)Take medications as prescribed-- 3)Abstinence from tobacco advised--- okay to use over-the-counter nicotine  patch to help you quit smoking 4)Follow-up with primary care physician in 1 week for recheck and reevaluation   Allergies as of 05/31/2023       Reactions   Aspirin    Pt. States it makes them jittery.        Medication List     STOP taking these medications    HYDROcodone-acetaminophen 5-325 MG tablet Commonly known as: NORCO/VICODIN   naproxen 500 MG tablet Commonly known as: NAPROSYN   oxyCODONE-acetaminophen 5-325 MG tablet Commonly known as: PERCOCET/ROXICET       TAKE these medications    acetaminophen 325 MG tablet Commonly known as: TYLENOL Take 2 tablets (650 mg total) by mouth every 6 (six) hours as needed for mild pain (pain score 1-3), fever or headache.   acyclovir 400 MG tablet Commonly known as: ZOVIRAX Take 1 tablet (400 mg total) by mouth 4 (four) times daily.   albuterol 108 (90 Base) MCG/ACT inhaler Commonly known as: VENTOLIN HFA Inhale  2 puffs into the lungs every 6 (six) hours as needed for wheezing or shortness of breath.   aspirin EC 81 MG tablet Take 81 mg by mouth every morning.   atorvastatin 20 MG tablet Commonly known as: Lipitor Take 1 tablet (20 mg total) by mouth daily.   doxycycline 100 MG tablet Commonly known as: VIBRA-TABS Take 1 tablet (100 mg total) by mouth 2 (two) times daily for 7 days.   guaiFENesin 600 MG 12 hr tablet Commonly known as: Mucinex Take 1 tablet (600 mg total) by mouth 2 (two) times daily for 10 days.   ondansetron 4 MG tablet Commonly known as: ZOFRAN Take 1 tablet (4 mg total) by mouth every 6 (six) hours as needed for nausea or vomiting.   oseltamivir 75 MG capsule Commonly known as: Tamiflu Take 1 capsule (75 mg total) by mouth 2 (two) times daily for 5 days.   pantoprazole 40 MG tablet Commonly known as: Protonix Take 1 tablet (40 mg total) by mouth daily. What changed:  medication strength how much to take   predniSONE 20 MG tablet Commonly known as: DELTASONE Take 2 tablets (40 mg  total) by mouth daily with breakfast for 5 days.   UNKNOWN TO PATIENT Take 1 tablet by mouth every morning. BP/FLUID COMBO   UNKNOWN TO PATIENT Take 1 tablet by mouth every morning. FLUID MEDICATION   VITAMIN D PO Take 1 capsule by mouth every morning.       Dispo: The patient is from: Home              Anticipated d/c is to: Home            With History of - Reviewed by me  Past Medical History:  Diagnosis Date   Chronic constipation    CVA (cerebral infarction) 09/2010   CVA (cerebral infarction)    in March of this year which she describes as slight.   Dysphagia    GERD (gastroesophageal reflux disease)    Hypertension    Stroke (HCC)    Weakness of left leg    and left arm from a CVA      Past Surgical History:  Procedure Laterality Date   COLONOSCOPY N/A 03/01/2013   Procedure: COLONOSCOPY;  Surgeon: Malissa Hippo, MD;  Location: [facility];  Service: Endoscopy;  Laterality: N/A;  815   CYSTECTOMY     shoulder   TOTAL ABDOMINAL HYSTERECTOMY      Chief Complaint  Patient presents with   Cough      HPI:    [name]  is a 77 y.o. female who pathological history relevant for history of prior stroke, GERD with ongoing tobacco abuse and COPD presents to ED with over 24 hours of fevers chills myalgias and fatigue, as well as cough and occasional wheezing -She is Not vaccinated against the flu -She drove herself to the ED -EKG without acute findings, tachycardia noted -Chest x-ray with patchy left retrocardiac opacity which may represent atelectasis or pneumonia -RSV and COVID-negative -Influenza A+ve  No Nausea, Vomiting or Diarrhea -No chest pains no palpitations, - post ambulation  O2 sats in the ED is 95 to 97% on room air -Lactic acid noted----patient received IV fluids -Blood cultures pending -WBC 6.2 , hemoglobin 14.9 platelets 149 -Sodium 135 potassium 3.5 bicarb bicarb 25, anion gap 12 creatinine 1.0, LFTs are not elevated - In the ED  patient received nebulizer treatments mucolytics, IV steroids and IV fluids -As well  as Toradol -Patient is feeling much better -She had transient tachycardia and tachypnea initially in the setting of fever and bronchodilators/nebulizer treatment -Tachycardia and tachypnea resolved with IV fluids    Review of systems:    In addition to the HPI above,   A full Review of  Systems was done, all other systems reviewed are negative except as noted above in HPI , .   Social History:  Reviewed by me    Social History   Tobacco Use   Smoking status: Every Day    Current packs/day: 0.25    Average packs/day: 0.3 packs/day for 20.0 years (5.0 ttl pk-yrs)    Types: Cigarettes   Smokeless tobacco: Never   Tobacco comments:    3 cigarettes a day  Substance Use Topics   Alcohol use: No     Family History :  Reviewed by me   Family History  Problem Relation Age of Onset   Colon cancer Neg Hx    Colon polyps Sister      Home Medications:   Prior to Admission medications   Medication Sig Start Date End Date Taking? Authorizing Provider  acetaminophen (TYLENOL) 325 MG tablet Take 2 tablets (650 mg total) by mouth every 6 (six) hours as needed for mild pain (pain score 1-3), fever or headache. 05/31/23  Yes Aleicia Kenagy, MD  albuterol (VENTOLIN HFA) 108 (90 Base) MCG/ACT inhaler Inhale 2 puffs into the lungs every 6 (six) hours as needed for wheezing or shortness of breath. 05/31/23  Yes Jasher Barkan, MD  doxycycline (VIBRA-TABS) 100 MG tablet Take 1 tablet (100 mg total) by mouth 2 (two) times daily for 7 days. 05/31/23 06/07/23 Yes Mikyle Sox, MD  guaiFENesin (MUCINEX) 600 MG 12 hr tablet Take 1 tablet (600 mg total) by mouth 2 (two) times daily for 10 days. 05/31/23 06/10/23 Yes Masson Nalepa, MD  oseltamivir (TAMIFLU) 75 MG capsule Take 1 capsule (75 mg total) by mouth 2 (two) times daily for 5 days. 05/31/23 06/05/23 Yes Jye Fariss, MD  predniSONE (DELTASONE) 20 MG  tablet Take 2 tablets (40 mg total) by mouth daily with breakfast for 5 days. 05/31/23 06/05/23 Yes Jalyne Brodzinski, MD  acyclovir (ZOVIRAX) 400 MG tablet Take 1 tablet (400 mg total) by mouth 4 (four) times daily. 04/10/14   Zadie Rhine, MD  aspirin EC 81 MG tablet Take 81 mg by mouth every morning.     [provider]  Cholecalciferol (VITAMIN D PO) Take 1 capsule by mouth every morning.    [provider]  ondansetron (ZOFRAN) 4 MG tablet Take 1 tablet (4 mg total) by mouth every 6 (six) hours as needed for nausea or vomiting. 11/03/22   Achille Rich, PA-C  pantoprazole (PROTONIX) 20 MG tablet Take 1 tablet (20 mg total) by mouth daily. 05/31/23   Shon Hale, MD  UNKNOWN TO PATIENT Take 1 tablet by mouth every morning. BP/FLUID COMBO    [provider]  UNKNOWN TO PATIENT Take 1 tablet by mouth every morning. FLUID MEDICATION    [provider]     Allergies:     Allergies  Allergen Reactions   Aspirin     Pt. States it makes them jittery.     Physical Exam:   Vitals  Blood pressure 116/65, pulse 87, temperature 98 F (36.7 C), temperature source Oral, resp. rate 20, height 5\' 4"  (1.626 m), weight 77.1 kg, SpO2 97%.  Physical Examination: General appearance - alert,  in no distress, no  conversational dyspnea Mental status - alert, oriented to person, place, and time,  Eyes - sclera anicteric Neck - supple, no JVD elevation , Chest -overall good air movement, wheezes resolved after bronchodilator treatment Heart - S1 and S2 normal, regular  Abdomen - soft, nontender, nondistended, +BS Neurological - screening mental status exam normal, neck supple without rigidity, cranial nerves II through XII intact, DTR's normal and symmetric Extremities - no pedal edema noted, intact peripheral pulses  Skin - warm, dry     Data Review:    CBC Recent Labs  Lab 05/31/23 1110  WBC 6.2  HGB 14.9  HCT 46.0  PLT 149*  MCV 95.0  MCH 30.8   MCHC 32.4  RDW 15.0  LYMPHSABS 0.6*  MONOABS 0.4  EOSABS 0.0  BASOSABS 0.0   ------------------------------------------------------------------------------------------------------------------  Chemistries  Recent Labs  Lab 05/31/23 1110  NA 135  K 3.5  CL 98  CO2 25  GLUCOSE 110*  BUN 15  CREATININE 1.04*  CALCIUM 9.1  AST 35  ALT 25  ALKPHOS 80  BILITOT 1.0   ------------------------------------------------------------------------------------------------------------------ estimated creatinine clearance is 45.6 mL/min (A) (by C-G formula based on SCr of 1.04 mg/dL (H)). ------------------------------------------------------------------------------------------------------------------ Urinalysis    Component Value Date/Time   COLORURINE STRAW (A) 11/03/2022 1039   APPEARANCEUR CLEAR 11/03/2022 1039   LABSPEC 1.013 11/03/2022 1039   PHURINE 7.0 11/03/2022 1039   GLUCOSEU NEGATIVE 11/03/2022 1039   HGBUR SMALL (A) 11/03/2022 1039   BILIRUBINUR NEGATIVE 11/03/2022 1039   KETONESUR NEGATIVE 11/03/2022 1039   PROTEINUR NEGATIVE 11/03/2022 1039   UROBILINOGEN 0.2 09/30/2010 1217   NITRITE NEGATIVE 11/03/2022 1039   LEUKOCYTESUR NEGATIVE 11/03/2022 1039    ----------------------------------------------------------------------------------------------------------------   Imaging Results:    DG Chest Port 1 View  Result Date: 05/31/2023 CLINICAL DATA:  Two day history of cough, fever, sticking sensation chest EXAM: PORTABLE CHEST 1 VIEW COMPARISON:  Chest radiograph dated 11/03/2022 FINDINGS: Mildly low lung volumes. Patchy left retrocardiac opacity. No pleural effusion or pneumothorax. The heart size and mediastinal contours are within normal limits. No acute osseous abnormality. IMPRESSION: Patchy left retrocardiac opacity, which may represent atelectasis or pneumonia. Electronically Signed   By: Agustin Cree M.D.   On: 05/31/2023 12:14    Radiological Exams on  Admission: DG Chest Port 1 View  Result Date: 05/31/2023 CLINICAL DATA:  Two day history of cough, fever, sticking sensation chest EXAM: PORTABLE CHEST 1 VIEW COMPARISON:  Chest radiograph dated 11/03/2022 FINDINGS: Mildly low lung volumes. Patchy left retrocardiac opacity. No pleural effusion or pneumothorax. The heart size and mediastinal contours are within normal limits. No acute osseous abnormality. IMPRESSION: Patchy left retrocardiac opacity, which may represent atelectasis or pneumonia. Electronically Signed   By: Agustin Cree M.D.   On: 05/31/2023 12:14      Condition   --stable without hypoxia  Shon Hale M.D on 05/31/2023 at 5:38 PM Go to [email or web address] -  for contact info  [facility] - Office  [phone]

## 2023-05-31 NOTE — ED Notes (Signed)
Pt assisted to bathroom. Pt's pull-up wet. Clean brief applied. Pt assisted back to bed.

## 2023-05-31 NOTE — ED Notes (Signed)
Pt assisted to bathroom

## 2023-05-31 NOTE — ED Notes (Signed)
Pt denies needs at this time. Bed locked and in lowest position. Call light within reach.

## 2023-05-31 NOTE — Discharge Instructions (Addendum)
1)Drink plenty fluids and avoid dehydration 2)Take medications as prescribed-- 3)Abstinence from Tobacco advised--- okay to use over-the-counter nicotine patch to help you quit smoking 4)Follow-up with primary care physician in 1 week for recheck and reevaluation

## 2023-06-05 LAB — CULTURE, BLOOD (ROUTINE X 2)
Culture: NO GROWTH
Culture: NO GROWTH

## 2023-06-09 DIAGNOSIS — H35341 Macular cyst, hole, or pseudohole, right eye: Secondary | ICD-10-CM | POA: Diagnosis not present

## 2023-06-09 DIAGNOSIS — S0502XA Injury of conjunctiva and corneal abrasion without foreign body, left eye, initial encounter: Secondary | ICD-10-CM | POA: Diagnosis not present

## 2023-07-26 DIAGNOSIS — M545 Low back pain, unspecified: Secondary | ICD-10-CM | POA: Diagnosis not present

## 2023-07-26 DIAGNOSIS — J40 Bronchitis, not specified as acute or chronic: Secondary | ICD-10-CM | POA: Diagnosis not present

## 2023-07-26 DIAGNOSIS — I10 Essential (primary) hypertension: Secondary | ICD-10-CM | POA: Diagnosis not present

## 2023-07-26 DIAGNOSIS — E7849 Other hyperlipidemia: Secondary | ICD-10-CM | POA: Diagnosis not present

## 2023-07-26 DIAGNOSIS — K2100 Gastro-esophageal reflux disease with esophagitis, without bleeding: Secondary | ICD-10-CM | POA: Diagnosis not present

## 2023-10-08 DIAGNOSIS — I10 Essential (primary) hypertension: Secondary | ICD-10-CM | POA: Diagnosis not present

## 2023-10-08 DIAGNOSIS — E7849 Other hyperlipidemia: Secondary | ICD-10-CM | POA: Diagnosis not present

## 2023-11-08 DIAGNOSIS — E7849 Other hyperlipidemia: Secondary | ICD-10-CM | POA: Diagnosis not present

## 2023-11-08 DIAGNOSIS — I10 Essential (primary) hypertension: Secondary | ICD-10-CM | POA: Diagnosis not present

## 2023-11-16 DIAGNOSIS — E7849 Other hyperlipidemia: Secondary | ICD-10-CM | POA: Diagnosis not present

## 2023-11-16 DIAGNOSIS — I10 Essential (primary) hypertension: Secondary | ICD-10-CM | POA: Diagnosis not present

## 2023-11-16 DIAGNOSIS — J42 Unspecified chronic bronchitis: Secondary | ICD-10-CM | POA: Diagnosis not present

## 2023-11-16 DIAGNOSIS — R600 Localized edema: Secondary | ICD-10-CM | POA: Diagnosis not present

## 2023-11-17 ENCOUNTER — Other Ambulatory Visit (HOSPITAL_COMMUNITY)
Admission: RE | Admit: 2023-11-17 | Discharge: 2023-11-17 | Disposition: A | Discharge status: Home / Self Care | Source: Ambulatory Visit | Attending: Internal Medicine | Admitting: Internal Medicine

## 2023-11-17 ENCOUNTER — Ambulatory Visit (HOSPITAL_COMMUNITY)
Admission: RE | Admit: 2023-11-17 | Discharge: 2023-11-17 | Disposition: A | Discharge status: Home / Self Care | Source: Ambulatory Visit | Attending: Gerontology | Admitting: Gerontology

## 2023-11-17 ENCOUNTER — Other Ambulatory Visit (HOSPITAL_COMMUNITY): Payer: Self-pay | Admitting: Gerontology

## 2023-11-17 DIAGNOSIS — R600 Localized edema: Secondary | ICD-10-CM | POA: Insufficient documentation

## 2023-11-17 DIAGNOSIS — R0602 Shortness of breath: Secondary | ICD-10-CM

## 2023-11-17 DIAGNOSIS — R062 Wheezing: Secondary | ICD-10-CM | POA: Diagnosis not present

## 2023-11-17 LAB — CBC WITH DIFFERENTIAL/PLATELET
Abs Immature Granulocytes: 0.01 10*3/uL (ref 0.00–0.07)
Basophils Absolute: 0 10*3/uL (ref 0.0–0.1)
Basophils Relative: 1 %
Eosinophils Absolute: 0.2 10*3/uL (ref 0.0–0.5)
Eosinophils Relative: 4 %
HCT: 35.8 % — ABNORMAL LOW (ref 36.0–46.0)
Hemoglobin: 11.6 g/dL — ABNORMAL LOW (ref 12.0–15.0)
Immature Granulocytes: 0 %
Lymphocytes Relative: 35 %
Lymphs Abs: 1.9 10*3/uL (ref 0.7–4.0)
MCH: 30.1 pg (ref 26.0–34.0)
MCHC: 32.4 g/dL (ref 30.0–36.0)
MCV: 92.7 fL (ref 80.0–100.0)
Monocytes Absolute: 0.5 10*3/uL (ref 0.1–1.0)
Monocytes Relative: 9 %
Neutro Abs: 2.7 10*3/uL (ref 1.7–7.7)
Neutrophils Relative %: 51 %
Platelets: 248 10*3/uL (ref 150–400)
RBC: 3.86 MIL/uL — ABNORMAL LOW (ref 3.87–5.11)
RDW: 14.6 % (ref 11.5–15.5)
WBC: 5.3 10*3/uL (ref 4.0–10.5)
nRBC: 0 % (ref 0.0–0.2)

## 2023-11-17 LAB — BASIC METABOLIC PANEL WITH GFR
Anion gap: 10 (ref 5–15)
BUN: 19 mg/dL (ref 8–23)
CO2: 30 mmol/L (ref 22–32)
Calcium: 9.4 mg/dL (ref 8.9–10.3)
Chloride: 98 mmol/L (ref 98–111)
Creatinine, Ser: 1.12 mg/dL — ABNORMAL HIGH (ref 0.44–1.00)
GFR, Estimated: 51 mL/min — ABNORMAL LOW (ref >60)
Glucose, Bld: 120 mg/dL — ABNORMAL HIGH (ref 70–99)
Potassium: 3.3 mmol/L — ABNORMAL LOW (ref 3.5–5.1)
Sodium: 138 mmol/L (ref 135–145)

## 2023-12-09 DIAGNOSIS — Z0100 Encounter for examination of eyes and vision without abnormal findings: Secondary | ICD-10-CM | POA: Diagnosis not present

## 2023-12-09 DIAGNOSIS — H35372 Puckering of macula, left eye: Secondary | ICD-10-CM | POA: Diagnosis not present

## 2023-12-16 DIAGNOSIS — E7849 Other hyperlipidemia: Secondary | ICD-10-CM | POA: Diagnosis not present

## 2023-12-16 DIAGNOSIS — I10 Essential (primary) hypertension: Secondary | ICD-10-CM | POA: Diagnosis not present

## 2024-01-13 ENCOUNTER — Ambulatory Visit (INDEPENDENT_AMBULATORY_CARE_PROVIDER_SITE_OTHER)

## 2024-01-13 ENCOUNTER — Ambulatory Visit (INDEPENDENT_AMBULATORY_CARE_PROVIDER_SITE_OTHER): Admitting: Podiatry

## 2024-01-13 DIAGNOSIS — M25472 Effusion, left ankle: Secondary | ICD-10-CM | POA: Diagnosis not present

## 2024-01-13 DIAGNOSIS — B351 Tinea unguium: Secondary | ICD-10-CM | POA: Diagnosis not present

## 2024-01-13 DIAGNOSIS — M79675 Pain in left toe(s): Secondary | ICD-10-CM

## 2024-01-13 DIAGNOSIS — M79674 Pain in right toe(s): Secondary | ICD-10-CM | POA: Diagnosis not present

## 2024-01-13 DIAGNOSIS — M25474 Effusion, right foot: Secondary | ICD-10-CM

## 2024-01-13 DIAGNOSIS — M25471 Effusion, right ankle: Secondary | ICD-10-CM

## 2024-01-13 DIAGNOSIS — M25475 Effusion, left foot: Secondary | ICD-10-CM

## 2024-01-13 NOTE — Patient Instructions (Signed)
 [facility] Address: [address] [place], [place] [address] Phone: [phone]

## 2024-01-16 DIAGNOSIS — E7849 Other hyperlipidemia: Secondary | ICD-10-CM | POA: Diagnosis not present

## 2024-01-16 DIAGNOSIS — I10 Essential (primary) hypertension: Secondary | ICD-10-CM | POA: Diagnosis not present

## 2024-01-16 NOTE — Progress Notes (Signed)
  Subjective:  Patient ID: [name], female    DOB: 1946/01/31,  MRN: [id number]  Chief Complaint  Patient presents with   Foot Swelling    RM#11 Patient presents today will bilateral ankle and pain ongoing for several months using compression soaks and fluid pills not helping.    Discussed the use of AI scribe software for clinical note transcription with the patient, who gave verbal consent to proceed.  History of Present Illness MIREILLE LACOMBE is a 78 year old female who presents with swelling in both legs and feet.  Swelling in both legs and feet has been persistent. Compression socks are used intermittently due to application difficulty and discomfort, sometimes causing shortness of breath and cutting into her legs. Occasional pain occurs in the toes, especially around the big toe, without numbness or tingling. Swelling hinders her ability to properly fasten dress shoes, causing discomfort and improper fit.  Also states that she needs her toenails trimmed as they are elongated and causing discomfort.  No diabetes is present.      Objective:    Physical Exam General: AAO x3, NAD  Dermatological: Nails are hypertrophic, dystrophic, brittle, discolored, elongated 10. No surrounding redness or drainage. Tenderness nails 1-5 bilaterally. No open lesions or pre-ulcerative lesions are identified today.   Vascular: Difficult to palpate pulses given the swelling but she does have a palpable DP pulse.  Both feet are warm and well-perfused and capillary fill time is less than 3 seconds all the toes.  Chronic appearing edema present bilaterally without any pain with calf compression, erythema or warmth.  Neruologic: Grossly intact via light touch bilateral.   Musculoskeletal: There are some tenderness diffusely localized with the swelling but there is no specific area pinpoint tenderness.  Flexor, extensor tendons appear to be intact.  Gait: Unassisted, Nonantalgic.     No  images are attached to the encounter.    Results   RADIOLOGY X-rays: X-rays of bilateral ankles were performed.  Joints appear normal. No fractures observed. Swelling noted.  Calcaneal spurs noted. Hammertoes present.    Assessment:   1. Bilateral swelling of feet and ankles   2.      Onychomycosis 3.      Pain in toes    Plan:  Patient was evaluated and treated and all questions answered.  Assessment and Plan Assessment & Plan Leg and foot swelling Chronic bilateral swelling without wounds or drainage. No diabetes. Risk of wounds and infections if unmanaged. Ultrasound needed to assess venous function. - Order leg ultrasound to assess venous function. - Prescribe Juxtalite compression socks. - Advise daily use of compression socks and leg elevation.  Symptomatic onychomycosis -Sharply debrided nails x 10 without any complications or bleeding   Schedule follow-up in three months.  Return in about 3 months (around 04/14/2024).   Donnice JONELLE Fees DPM

## 2024-02-09 ENCOUNTER — Ambulatory Visit (HOSPITAL_COMMUNITY)
Admission: RE | Admit: 2024-02-09 | Discharge: 2024-02-09 | Disposition: A | Discharge status: Home / Self Care | Source: Ambulatory Visit | Attending: Podiatry | Admitting: Podiatry

## 2024-02-09 DIAGNOSIS — M25475 Effusion, left foot: Secondary | ICD-10-CM | POA: Diagnosis not present

## 2024-02-09 DIAGNOSIS — M25471 Effusion, right ankle: Secondary | ICD-10-CM | POA: Diagnosis not present

## 2024-02-09 DIAGNOSIS — M25472 Effusion, left ankle: Secondary | ICD-10-CM | POA: Diagnosis not present

## 2024-02-09 DIAGNOSIS — M25474 Effusion, right foot: Secondary | ICD-10-CM | POA: Insufficient documentation

## 2024-02-09 DIAGNOSIS — R600 Localized edema: Secondary | ICD-10-CM | POA: Diagnosis not present

## 2024-02-14 ENCOUNTER — Ambulatory Visit: Payer: Self-pay | Admitting: Podiatry

## 2024-02-15 DIAGNOSIS — I10 Essential (primary) hypertension: Secondary | ICD-10-CM | POA: Diagnosis not present

## 2024-02-15 DIAGNOSIS — E7849 Other hyperlipidemia: Secondary | ICD-10-CM | POA: Diagnosis not present

## 2024-02-22 NOTE — Progress Notes (Signed)
 Spoke with patient is are of results and recommendations.

## 2024-03-09 ENCOUNTER — Other Ambulatory Visit: Payer: Self-pay

## 2024-03-09 ENCOUNTER — Encounter (HOSPITAL_COMMUNITY): Payer: Self-pay

## 2024-03-09 ENCOUNTER — Emergency Department (HOSPITAL_COMMUNITY)
Admission: EM | Admit: 2024-03-09 | Discharge: 2024-03-10 | Disposition: A | Discharge status: Home / Self Care | Attending: Emergency Medicine | Admitting: Emergency Medicine

## 2024-03-09 DIAGNOSIS — I252 Old myocardial infarction: Secondary | ICD-10-CM | POA: Insufficient documentation

## 2024-03-09 DIAGNOSIS — Z7982 Long term (current) use of aspirin: Secondary | ICD-10-CM | POA: Diagnosis not present

## 2024-03-09 DIAGNOSIS — I491 Atrial premature depolarization: Secondary | ICD-10-CM | POA: Diagnosis not present

## 2024-03-09 DIAGNOSIS — I517 Cardiomegaly: Secondary | ICD-10-CM | POA: Insufficient documentation

## 2024-03-09 DIAGNOSIS — K219 Gastro-esophageal reflux disease without esophagitis: Secondary | ICD-10-CM | POA: Diagnosis not present

## 2024-03-09 DIAGNOSIS — R079 Chest pain, unspecified: Secondary | ICD-10-CM | POA: Diagnosis not present

## 2024-03-09 DIAGNOSIS — I441 Atrioventricular block, second degree: Secondary | ICD-10-CM | POA: Insufficient documentation

## 2024-03-09 NOTE — ED Triage Notes (Signed)
 Pov from home. Cc of acid reflux. Was seen and given a script but did not get it filled. Says the pain is back.

## 2024-03-10 ENCOUNTER — Emergency Department (HOSPITAL_COMMUNITY)

## 2024-03-10 DIAGNOSIS — R079 Chest pain, unspecified: Secondary | ICD-10-CM | POA: Diagnosis not present

## 2024-03-10 LAB — CBC WITH DIFFERENTIAL/PLATELET
Abs Immature Granulocytes: 0.02 K/uL (ref 0.00–0.07)
Basophils Absolute: 0 K/uL (ref 0.0–0.1)
Basophils Relative: 1 %
Eosinophils Absolute: 0.1 K/uL (ref 0.0–0.5)
Eosinophils Relative: 3 %
HCT: 36.8 % (ref 36.0–46.0)
Hemoglobin: 12 g/dL (ref 12.0–15.0)
Immature Granulocytes: 0 %
Lymphocytes Relative: 35 %
Lymphs Abs: 1.9 K/uL (ref 0.7–4.0)
MCH: 30.2 pg (ref 26.0–34.0)
MCHC: 32.6 g/dL (ref 30.0–36.0)
MCV: 92.7 fL (ref 80.0–100.0)
Monocytes Absolute: 0.5 K/uL (ref 0.1–1.0)
Monocytes Relative: 10 %
Neutro Abs: 2.9 K/uL (ref 1.7–7.7)
Neutrophils Relative %: 51 %
Platelets: 247 K/uL (ref 150–400)
RBC: 3.97 MIL/uL (ref 3.87–5.11)
RDW: 14.6 % (ref 11.5–15.5)
WBC: 5.5 K/uL (ref 4.0–10.5)
nRBC: 0 % (ref 0.0–0.2)

## 2024-03-10 LAB — COMPREHENSIVE METABOLIC PANEL WITH GFR
ALT: 29 U/L (ref 0–44)
AST: 38 U/L (ref 15–41)
Albumin: 3.9 g/dL (ref 3.5–5.0)
Alkaline Phosphatase: 107 U/L (ref 38–126)
Anion gap: 10 (ref 5–15)
BUN: 23 mg/dL (ref 8–23)
CO2: 29 mmol/L (ref 22–32)
Calcium: 9.2 mg/dL (ref 8.9–10.3)
Chloride: 98 mmol/L (ref 98–111)
Creatinine, Ser: 1.12 mg/dL — ABNORMAL HIGH (ref 0.44–1.00)
GFR, Estimated: 50 mL/min — ABNORMAL LOW (ref >60)
Glucose, Bld: 112 mg/dL — ABNORMAL HIGH (ref 70–99)
Potassium: 3.7 mmol/L (ref 3.5–5.1)
Sodium: 137 mmol/L (ref 135–145)
Total Bilirubin: 1.1 mg/dL (ref 0.0–1.2)
Total Protein: 7.3 g/dL (ref 6.5–8.1)

## 2024-03-10 LAB — TROPONIN I (HIGH SENSITIVITY)
Troponin I (High Sensitivity): 5 ng/L (ref <18)
Troponin I (High Sensitivity): 6 ng/L (ref <18)

## 2024-03-10 LAB — LIPASE, BLOOD: Lipase: 32 U/L (ref 11–51)

## 2024-03-10 LAB — TSH: TSH: 2.042 u[IU]/mL (ref 0.350–4.500)

## 2024-03-10 LAB — T4, FREE: Free T4: 1.15 ng/dL — ABNORMAL HIGH (ref 0.61–1.12)

## 2024-03-10 MED ORDER — ALUM & MAG HYDROXIDE-SIMETH 200-200-20 MG/5ML PO SUSP
30.0000 mL | Freq: Once | ORAL | Status: AC
  Administered 2024-03-10: 30 mL via ORAL
  Filled 2024-03-10: qty 30

## 2024-03-10 MED ORDER — FAMOTIDINE 20 MG PO TABS
40.0000 mg | ORAL_TABLET | Freq: Once | ORAL | Status: AC
  Administered 2024-03-10: 40 mg via ORAL
  Filled 2024-03-10: qty 2

## 2024-03-10 NOTE — ED Provider Notes (Signed)
 [facility] EMERGENCY DEPARTMENT AT [facility] Provider Note   CSN: [id number] Arrival date & time: 03/09/24  2257     Patient presents with: Gastroesophageal Reflux   [name] is a 78 y.o. female.   Patient thinks she is experiencing reflux.  She does have a history of reflux and has run out of her tonics, was not able to get her refill today.  She reports that she gets discomfort all the way up into the throat when she eats.  Today she has been having nagging pains in the left chest.       Prior to Admission medications   Medication Sig Start Date End Date Taking? Authorizing Provider  acetaminophen  (TYLENOL ) 325 MG tablet Take 2 tablets (650 mg total) by mouth every 6 (six) hours as needed for mild pain (pain score 1-3), fever or headache. 05/31/23   Pearlean Manus, MD  acyclovir  (ZOVIRAX ) 400 MG tablet Take 1 tablet (400 mg total) by mouth 4 (four) times daily. 04/10/14   Midge Golas, MD  albuterol  (VENTOLIN  HFA) [phone] Base) MCG/ACT inhaler Inhale 2 puffs into the lungs every 6 (six) hours as needed for wheezing or shortness of breath. 05/31/23   Pearlean Manus, MD  aspirin EC 81 MG tablet Take 81 mg by mouth every morning.     [provider]  atorvastatin  (LIPITOR) 20 MG tablet Take 1 tablet (20 mg total) by mouth daily. 05/31/23 05/30/24  Pearlean Manus, MD  Cholecalciferol (VITAMIN D PO) Take 1 capsule by mouth every morning.    [provider]  ondansetron  (ZOFRAN ) 4 MG tablet Take 1 tablet (4 mg total) by mouth every 6 (six) hours as needed for nausea or vomiting. 11/03/22   Bernis Ernst, PA-C  pantoprazole  (PROTONIX ) 40 MG tablet Take 1 tablet (40 mg total) by mouth daily. 05/31/23 05/30/24  Pearlean Manus, MD  UNKNOWN TO PATIENT Take 1 tablet by mouth every morning. BP/FLUID COMBO    [provider]  UNKNOWN TO PATIENT Take 1 tablet by mouth every morning. FLUID MEDICATION    [provider]    Allergies:  Aspirin    Review of Systems  Updated Vital Signs BP (!) 121/55   Pulse (!) 38   Temp 98 F (36.7 C) (Oral)   Resp (!) 22   Ht 5' 4 (1.626 m)   Wt 91.6 kg   SpO2 98%   BMI 34.67 kg/m   Physical Exam Vitals and nursing note reviewed.  Constitutional:      General: She is not in acute distress.    Appearance: She is well-developed.  HENT:     Head: Normocephalic and atraumatic.     Mouth/Throat:     Mouth: Mucous membranes are moist.  Eyes:     General: Vision grossly intact. Gaze aligned appropriately.     Extraocular Movements: Extraocular movements intact.     Conjunctiva/sclera: Conjunctivae normal.  Cardiovascular:     Rate and Rhythm: Normal rate and regular rhythm.     Pulses: Normal pulses.     Heart sounds: Normal heart sounds, S1 normal and S2 normal. No murmur heard.    No friction rub. No gallop.  Pulmonary:     Effort: Pulmonary effort is normal. No respiratory distress.     Breath sounds: Normal breath sounds.  Abdominal:     General: Bowel sounds are normal.     Palpations: Abdomen is soft.     Tenderness: There is no abdominal tenderness.  There is no guarding or rebound.     Hernia: No hernia is present.  Musculoskeletal:        General: No swelling.     Cervical back: Full passive range of motion without pain, normal range of motion and neck supple. No spinous process tenderness or muscular tenderness. Normal range of motion.     Right lower leg: No edema.     Left lower leg: No edema.  Skin:    General: Skin is warm and dry.     Capillary Refill: Capillary refill takes less than 2 seconds.     Findings: No ecchymosis, erythema, rash or wound.  Neurological:     General: No focal deficit present.     Mental Status: She is alert and oriented to person, place, and time.     GCS: GCS eye subscore is 4. GCS verbal subscore is 5. GCS motor subscore is 6.     Cranial Nerves: Cranial nerves 2-12 are intact.     Sensory: Sensation is intact.     Motor:  Motor function is intact.     Coordination: Coordination is intact.  Psychiatric:        Attention and Perception: Attention normal.        Mood and Affect: Mood normal.        Speech: Speech normal.        Behavior: Behavior normal.     (all labs ordered are listed, but only abnormal results are displayed) Labs Reviewed  COMPREHENSIVE METABOLIC PANEL WITH GFR - Abnormal; Notable for the following components:      Result Value   Glucose, Bld 112 (*)    Creatinine, Ser 1.12 (*)    GFR, Estimated 50 (*)    All other components within normal limits  CBC WITH DIFFERENTIAL/PLATELET  LIPASE, BLOOD  TSH  T4, FREE  TROPONIN I (HIGH SENSITIVITY)  TROPONIN I (HIGH SENSITIVITY)    EKG: EKG Interpretation Date/Time:  Friday March 10 2024 00:47:24 EDT Ventricular Rate:  50 PR Interval:  68 QRS Duration:  98 QT Interval:  458 QTC Calculation: 364 R Axis:   15  Text Interpretation: Second degree AV block, Mobitz I Right atrial enlargement Low voltage, precordial leads Confirmed by Haze Lonni PARAS ([id number]) on 03/10/2024 1:23:54 AM  Radiology: [facility] Chest Port 1 View Result Date: 03/10/2024 CLINICAL DATA:  Chest pain. EXAM: PORTABLE CHEST 1 VIEW COMPARISON:  November 17, 2023 FINDINGS: The heart size and mediastinal contours are within normal limits. There is marked severity calcification of the thoracic aorta. Both lungs are clear. Multilevel degenerative changes seen throughout the thoracic spine. IMPRESSION: No active disease. Electronically Signed   By: Suzen Dials M.D.   On: 03/10/2024 01:10     Procedures   Medications Ordered in the ED  famotidine  (PEPCID ) tablet 40 mg (40 mg Oral Given 03/10/24 0055)  alum & mag hydroxide-simeth (MAALOX/MYLANTA) 200-200-20 MG/5ML suspension 30 mL (30 mLs Oral Given 03/10/24 0056)                                    Medical Decision Making Amount and/or Complexity of Data Reviewed External Data Reviewed: labs, radiology, ECG and  notes. Labs: ordered. Decision-making details documented in ED Course. Radiology: ordered and independent interpretation performed. Decision-making details documented in ED Course.  Risk OTC drugs.   Differential Diagnosis considered includes, but not limited to: STEMI; NSTEMI; myocarditis; pericarditis;  pulmonary embolism; aortic dissection; pneumothorax; pneumonia; gastritis; musculoskeletal pain    Presents to the emergency department with concerns over acid reflux.  She has a history of reflux, has been out of her Protonix .  She reports that she has a prescription given to her by her primary doctor but did not fill it.  Patient reports symptoms after eating.  Patient does not have any known cardiac disease.  Her troponins have been negative.  Her initial EKG was mildly bradycardic and rhythm was second-degree AV block Mobitz 1.  She did have some episodes of further bradycardia while she was resting but no hypotension and was asymptomatic.  Repeat EKG once again shows [employer].  Discussed with Dr. Claudene, on-call for cardiology.  He did review the EKGs and agrees with the reading.  As patient has been stable she can be discharged and he will see her in the office Monday.  He requested thyroid  studies which will be drawn before she is discharged.     Final diagnoses:  Gastroesophageal reflux disease, unspecified whether esophagitis present  Wenckebach second degree AV block    ED Discharge Orders     None          Haze Lonni PARAS, MD 03/10/24 [phone]

## 2024-03-10 NOTE — ED Notes (Signed)
 ED Provider at bedside.

## 2024-03-13 DIAGNOSIS — K2100 Gastro-esophageal reflux disease with esophagitis, without bleeding: Secondary | ICD-10-CM | POA: Diagnosis not present

## 2024-03-13 DIAGNOSIS — I440 Atrioventricular block, first degree: Secondary | ICD-10-CM | POA: Diagnosis not present

## 2024-03-13 DIAGNOSIS — I10 Essential (primary) hypertension: Secondary | ICD-10-CM | POA: Diagnosis not present

## 2024-03-22 DIAGNOSIS — J449 Chronic obstructive pulmonary disease, unspecified: Secondary | ICD-10-CM | POA: Diagnosis not present

## 2024-03-22 DIAGNOSIS — R072 Precordial pain: Secondary | ICD-10-CM | POA: Diagnosis not present

## 2024-03-22 DIAGNOSIS — I10 Essential (primary) hypertension: Secondary | ICD-10-CM | POA: Diagnosis not present

## 2024-03-22 DIAGNOSIS — E7849 Other hyperlipidemia: Secondary | ICD-10-CM | POA: Diagnosis not present

## 2024-03-22 DIAGNOSIS — R0602 Shortness of breath: Secondary | ICD-10-CM | POA: Diagnosis not present

## 2024-03-22 DIAGNOSIS — E6609 Other obesity due to excess calories: Secondary | ICD-10-CM | POA: Diagnosis not present

## 2024-03-29 ENCOUNTER — Telehealth: Payer: Self-pay | Admitting: Podiatry

## 2024-03-29 NOTE — Telephone Encounter (Signed)
 Called and left message for patient to contact office to r/s to later date or move to A. Regals schedule.

## 2024-04-04 NOTE — Telephone Encounter (Signed)
 Tried calling patient for 2nd time to get appointment moved but voicemail was full. Appointment cancelled due to provider being out of office.

## 2024-04-13 DIAGNOSIS — I10 Essential (primary) hypertension: Secondary | ICD-10-CM | POA: Diagnosis not present

## 2024-04-13 DIAGNOSIS — E7849 Other hyperlipidemia: Secondary | ICD-10-CM | POA: Diagnosis not present

## 2024-04-14 ENCOUNTER — Ambulatory Visit: Admitting: Podiatry

## 2024-04-17 ENCOUNTER — Ambulatory Visit

## 2024-04-25 ENCOUNTER — Ambulatory Visit

## 2024-04-25 DIAGNOSIS — B351 Tinea unguium: Secondary | ICD-10-CM

## 2024-04-25 DIAGNOSIS — M79674 Pain in right toe(s): Secondary | ICD-10-CM | POA: Diagnosis not present

## 2024-04-25 DIAGNOSIS — M79675 Pain in left toe(s): Secondary | ICD-10-CM | POA: Diagnosis not present

## 2024-04-25 DIAGNOSIS — M25472 Effusion, left ankle: Secondary | ICD-10-CM

## 2024-04-25 DIAGNOSIS — M25471 Effusion, right ankle: Secondary | ICD-10-CM

## 2024-04-25 DIAGNOSIS — M25475 Effusion, left foot: Secondary | ICD-10-CM

## 2024-04-25 DIAGNOSIS — M25474 Effusion, right foot: Secondary | ICD-10-CM

## 2024-04-25 NOTE — Progress Notes (Signed)
  Subjective:  Patient ID: [name], female    DOB: Jan 26, 1946,  MRN: [id number]  Chief Complaint  Patient presents with   rfc    M13 Routine foot care   History of Present Illness [name] is a 78 year old female who presents with swelling in both legs and feet.  Swelling in both legs and feet has been persistent. Compression socks are used intermittently due to application difficulty and discomfort, sometimes causing shortness of breath and cutting into her legs. She states intermittent use of compression socks provided at last visit. She believes one of them has ripped. Occasional pain occurs in the toes, especially around the big toe, without numbness or tingling. Swelling hinders her ability to properly fasten dress shoes, causing discomfort and improper fit.  Also states that she needs her toenails trimmed as they are elongated and causing discomfort.  No diabetes is present. She has not had any changes to her feet or ankle since last visit. States her swelling is within her normal limits for end of the day.       Objective:    Physical Exam General: AAO x3, NAD  Dermatological: Nails are hypertrophic, dystrophic, brittle, discolored, elongated 10. No surrounding redness or drainage. Tenderness nails 1-5 bilaterally. No open lesions or pre-ulcerative lesions are identified today.   Vascular: Difficult to palpate pulses given the swelling but she does have a palpable DP pulse.  Both feet are warm and well-perfused and capillary fill time is less than 3 seconds all the toes.  Chronic appearing edema present bilaterally without any pain with calf compression, erythema or warmth.  Neruologic: Grossly intact via light touch bilateral.   Musculoskeletal: There are some tenderness diffusely localized with the swelling but there is no specific area pinpoint tenderness.  Flexor, extensor tendons appear to be intact.  Gait: Unassisted, Nonantalgic.     No images are  attached to the encounter.      Assessment:   1. Bilateral swelling of feet and ankles   2. Dermatophytosis of nail   3. Pain in toes of both feet     Plan:  Patient was evaluated and treated and all questions answered.  Assessment and Plan Assessment & Plan Leg and foot swelling Chronic bilateral swelling without wounds or drainage. No diabetes. Risk of wounds and infections if unmanaged. She states that her swelling today is within her normal limits. The calf is not tight, erythematous, or more painful than normal. She had a venous doppler after her last visit which did not find an acute DVT. It did show venous reflex.   Symptomatic onychomycosis -Sharply debrided nails x 10 without any complications or bleeding   Schedule follow-up in three months.    Prentice PARAS Renesmee Raine DPM

## 2024-06-12 ENCOUNTER — Ambulatory Visit: Attending: Internal Medicine | Admitting: Internal Medicine

## 2024-06-12 NOTE — Progress Notes (Signed)
 Erroneous encounter - please disregard.

## 2024-06-25 DIAGNOSIS — I10 Essential (primary) hypertension: Secondary | ICD-10-CM | POA: Diagnosis not present

## 2024-06-25 DIAGNOSIS — E7849 Other hyperlipidemia: Secondary | ICD-10-CM | POA: Diagnosis not present

## 2024-07-25 ENCOUNTER — Ambulatory Visit: Admitting: Podiatry

## 2024-08-02 ENCOUNTER — Telehealth: Payer: Self-pay

## 2024-08-02 DIAGNOSIS — J449 Chronic obstructive pulmonary disease, unspecified: Secondary | ICD-10-CM

## 2024-08-17 ENCOUNTER — Other Ambulatory Visit: Payer: Self-pay | Admitting: Licensed Clinical Social Worker

## 2024-08-17 ENCOUNTER — Encounter: Payer: Self-pay | Admitting: Licensed Clinical Social Worker

## 2024-09-05 ENCOUNTER — Other Ambulatory Visit: Admitting: Licensed Clinical Social Worker

## 2024-09-06 ENCOUNTER — Ambulatory Visit: Admitting: Internal Medicine
